# Patient Record
Sex: Male | Born: 1980 | Race: White | Hispanic: No | Marital: Single | Smoking: Current every day smoker
Health system: Southern US, Community
[De-identification: ages and names within clinical notes are randomized; demographics above are authoritative.]

## PROBLEM LIST (undated history)

## (undated) DIAGNOSIS — F1120 Opioid dependence, uncomplicated: Secondary | ICD-10-CM

## (undated) DIAGNOSIS — F329 Major depressive disorder, single episode, unspecified: Secondary | ICD-10-CM

## (undated) DIAGNOSIS — F319 Bipolar disorder, unspecified: Secondary | ICD-10-CM

## (undated) DIAGNOSIS — F32A Depression, unspecified: Secondary | ICD-10-CM

## (undated) DIAGNOSIS — F99 Mental disorder, not otherwise specified: Secondary | ICD-10-CM

## (undated) DIAGNOSIS — B1920 Unspecified viral hepatitis C without hepatic coma: Secondary | ICD-10-CM

---
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
## 2002-08-28 ENCOUNTER — Emergency Department (HOSPITAL_COMMUNITY): Admission: EM | Admit: 2002-08-28 | Discharge: 2002-08-28 | Payer: Self-pay

## 2008-08-10 ENCOUNTER — Emergency Department (HOSPITAL_COMMUNITY): Admission: EM | Admit: 2008-08-10 | Discharge: 2008-08-10 | Payer: Self-pay | Admitting: Emergency Medicine

## 2009-01-19 ENCOUNTER — Emergency Department (HOSPITAL_COMMUNITY): Admission: EM | Admit: 2009-01-19 | Discharge: 2009-01-19 | Payer: Self-pay | Admitting: Emergency Medicine

## 2009-01-28 ENCOUNTER — Emergency Department (HOSPITAL_COMMUNITY): Admission: EM | Admit: 2009-01-28 | Discharge: 2009-01-28 | Payer: Self-pay | Admitting: Emergency Medicine

## 2010-07-28 ENCOUNTER — Emergency Department (HOSPITAL_COMMUNITY): Payer: Self-pay

## 2010-07-28 ENCOUNTER — Emergency Department (HOSPITAL_COMMUNITY)
Admission: EM | Admit: 2010-07-28 | Discharge: 2010-07-28 | Disposition: A | Discharge status: Home / Self Care | Payer: Self-pay | Attending: Emergency Medicine | Admitting: Emergency Medicine

## 2010-07-28 DIAGNOSIS — R296 Repeated falls: Secondary | ICD-10-CM | POA: Insufficient documentation

## 2010-07-28 DIAGNOSIS — M25519 Pain in unspecified shoulder: Secondary | ICD-10-CM | POA: Insufficient documentation

## 2010-07-28 DIAGNOSIS — Y9302 Activity, running: Secondary | ICD-10-CM | POA: Insufficient documentation

## 2010-07-28 DIAGNOSIS — B1920 Unspecified viral hepatitis C without hepatic coma: Secondary | ICD-10-CM | POA: Insufficient documentation

## 2010-08-28 LAB — COMPREHENSIVE METABOLIC PANEL
Albumin: 4.3 g/dL (ref 3.5–5.2)
Alkaline Phosphatase: 82 U/L (ref 39–117)
BUN: 10 mg/dL (ref 6–23)
CO2: 27 mEq/L (ref 19–32)
Chloride: 103 mEq/L (ref 96–112)
Glucose, Bld: 78 mg/dL (ref 70–99)
Potassium: 3.7 mEq/L (ref 3.5–5.1)
Total Bilirubin: 0.6 mg/dL (ref 0.3–1.2)

## 2010-08-28 LAB — CBC
HCT: 45.6 % (ref 39.0–52.0)
Hemoglobin: 15.8 g/dL (ref 13.0–17.0)
WBC: 10.1 10*3/uL (ref 4.0–10.5)

## 2010-08-28 LAB — DIFFERENTIAL
Basophils Absolute: 0.1 10*3/uL (ref 0.0–0.1)
Basophils Relative: 1 % (ref 0–1)
Monocytes Absolute: 1 10*3/uL (ref 0.1–1.0)
Neutro Abs: 7 10*3/uL (ref 1.7–7.7)
Neutrophils Relative %: 69 % (ref 43–77)

## 2010-08-28 LAB — URINALYSIS, ROUTINE W REFLEX MICROSCOPIC
Bilirubin Urine: NEGATIVE
Nitrite: NEGATIVE
Specific Gravity, Urine: 1.006 (ref 1.005–1.030)
Urobilinogen, UA: 0.2 mg/dL (ref 0.0–1.0)

## 2010-08-28 LAB — RAPID URINE DRUG SCREEN, HOSP PERFORMED
Opiates: NOT DETECTED
Tetrahydrocannabinol: POSITIVE — AB

## 2010-08-28 LAB — ETHANOL: Alcohol, Ethyl (B): 58 mg/dL — ABNORMAL HIGH (ref 0–10)

## 2010-10-09 NOTE — H&P (Signed)
[facility]  Patient:    Martin Allen, Martin Allen                         MRN: [id number] Adm. Date:  11/08/99 Attending:  Jasmine Pang, M.D. CC:         Martin Allen at the [facility] Martin Allen, M.D.                   Psychiatric Admission Assessment  LOCATION:  Adolescent unit.  PATIENT IDENTIFICATION:  A 30 year old Caucasian male referred on involuntary papers.  HISTORY OF PRESENT ILLNESS:  The patient has a history of substance abuse (though two drug screens done immediately prior to admission were negative). He has been clean for a period of time, but states he relapsed 13 days ago. On the day of admission, he had become agitated and was acting in a bizarre and threatening manner.  He has also been feeling anxious and states he feels "I am jumping out of my skin."  He does not appear to be withdrawing from particular drugs since his two drug screens (one in the ER and one done in our unit were negative).  PAST PSYCHIATRIC HISTORY:  The patient sees Dr. Fredna Allen. Allen.  He also sees Martin Allen at [facility].  CURRENT MEDICATIONS:  Neurontin 600 mg t.i.d., Wellbutrin 200 mg p.o. b.i.d., Seroquel 300 mg p.o. q.h.s., and Depakote 500 mg p.o. b.i.d.  He states he has not been compliant with these medications recently.  SUBSTANCE ABUSE HISTORY:  The patient abuses Klonopin, cocaine, LSD, ecstasy, and alcohol.  He, as indicated as above, has been clean for a period of time until relapsing 13 days ago.  PAST MEDICAL HISTORY:  The patient has a history of sinus infection, treated with amoxicillin recently.  He also has had an abdominal thyroid profile; this is currently being worked up by his pediatrician.  By his family practice physician, it is thought he may be hyperthyroid.  He has had recent heart palpitations.  ALLERGIES:  CECLOR and PENICILLIN.  CURRENT  MEDICATIONS:  As per past psychiatric history.  SOCIAL HISTORY:  The patient lives with his grandfather.  He had been living with his father and fathers girlfriend, but had to leave their home after some conflicts.  They reportedly have a warrant out for breaking and entering of his fathers girlfriends house.  The patient has been out of school for the past two years.  FAMILY PSYCHIATRIC HISTORY:  There is a possible substance abuse problem on the paternal side.  This will be further investigated.  ABUSE HISTORY:  There has been no sexual or physical abuse.  LEGAL HISTORY:  Positive for the B&E charges.  ADMISSION MENTAL STATUS EXAMINATION:  The patient was a sullen, reserved, Caucasian male with poor eye contact.  Speech slurred (secondary to restarting his medications after having been off of them).  There were psychomotor agitation.  Mood was anxious.  Affect:  Constricted.  There were no suicidal or homicidal ideation.  There was no psychosis or perceptual disturbance. Thought processes were logical and goal-directed.  Thought content revealed no predominant theme.  The patient was alert and oriented x 4.  Short-term and long-term memory were unable to be tested at this time due to his level of distress and agitation.  General fund of knowledge  was age and education level appropriate.  Attention and concentration were poor.  Insight:  Minimal. Judgment:  Poor.  ADMISSION DIAGNOSES: Axis I:    1. Mood disorder, not otherwise specified            2. Polysubstance dependence.            3. Conduct disorder, adolescent onset. Axis II:   Deferred. Axis III:  Rule out hyperthyroidism. Axis IV:   Severe. Axis V:    GAF is 30.  ASSETS AND STRENGTHS:  The patient is healthy and able to be physically active.  The patient has a supportive grandparent.  PROBLEMS:  Mood instability with bizarre threatening behavior.  SHORT-TERM TREATMENT GOAL:  Resolution of threatening  behavior.  LONG-TERM TREATMENT GOAL:  Resolution of mood instability.  INITIAL PLAN OF CARE:  Discontinue Seroquel.  Begin Zyprexa 5 mg p.o. t.i.d. Will also begin Ativan 1 mg p.o. t.i.d.  The patient will be involved in unit therapeutic groups and activities.  The patient will be involved in family Allen.  ESTIMATED LENGTH OF INPATIENT TREATMENT:  FIve to seven days.  CONDITIONS NECESSARY FOR DISCHARGE:  No threatening others, bizarre behavior resolved.  INITIAL DISCHARGE PLAN:  The patient will return home to live with his g grandfather.  FOLLOW-UP Allen:  Will be with Martin Allen at the [facility].  FOLLOW-UP MEDICATION MANAGEMENT:  Martin Allen, M.D. DD:  11/08/99 TD:  11/11/99 Job: [id number]

## 2010-10-09 NOTE — Discharge Summary (Signed)
[facility]  Patient:    Martin Allen, Martin Allen                       MRN: [id number] Adm. Date:  [id number] Disc. Date: [id number] Attending:  Jasmine Pang                           Discharge Summary  DATE OF BIRTH:  [birth year]  REASON FOR ADMISSION:  This 30 year old white male was admitted for inpatient psychiatric stabilization because of the history of increasing symptoms of psychosis, bizarre and threatening behavior, and homicidal ideation.  HISTORY OF PRESENT ILLNESS:  This 30 year old Caucasian male was admitted on involuntary papers.  He has a long history of substance abuse and had apparently been receiving multiple urine drug screens and has had two recent drug screens positive for cannabis.  The patient also had been clean for a period of several months, but had allegedly relapsed 2-3 weeks ago.  On the day of admission he had become increasingly agitated and was acting bizarrely and threatening to kill anyone who got near him.  He also was admitting to feeling that people were out to get him and felt profoundly psychomotor agitated and felt like he wanted to "jump out of my skin."  He admitted to feeling anxious and frightened in that he needed to keep people at a distance because he was afraid they might hurt him and that he would have to harm someone to protect himself if he was threatened.  He reported feeling increasingly threatened to a point where he was felt to be a danger to others and involuntarily admitted.  PHYSICAL EXAMINATION:  Physical examination at the time of admission was remarkable only for a history of having facial plastic surgery following a dog bite as a child.  He otherwise had an unremarkable history and physical examination.  LABORATORY:  The patient underwent a laboratory workup to rule out any medical problems contributing to his symptomatology.  A urine drug screen and repeat were entirely negative.  CBC,  liver function tests, a basic metabolic panel, thyroid function tests, GGT, all were within normal limits.  HOSPITAL COURSE:  The patient on admission was profoundly paranoid, agitated, appeared to have disorganized thoughts.  He felt that one of his peers on the unit was out to harm him and he became increasingly threatening toward that individual, telling him that he would kill him.  He was separated from that individual, but became profoundly agitated and combative, requiring seclusion and restraint to prevent the patient from endangering himself or others.  He was initially started back on his outpatient medication which included Neurontin 600 mg p.o. t.i.d., Wellbutrin 200 mg p.o. b.i.d., Seroquel 300 mg p.o. q.h.s., and Depakote 500 mg p.o. b.i.d.; however, within a period of several hours the patient noted that he had not been taking these medications and had been noncompliant with treatment.  He also admitted to a long history of being noncompliant with taking any medications.  He was placed on a trial of Ativan in combination with Zyprexa which was of no benefit.  He was given an injection of 100 mg of Thorazine to attempt to decrease his agitation and was unresponsive to this.  He was then begun on a trial of haloperidol.  He received a dose of 5 mg p.o. and responded extremely well to this medicine with gradual clearing of psychosis over the  next several days.  The patient has tolerated this medication well with no side effects.  He was also begun on Cogentin at 1 mg p.o. b.i.d. to prevent any extrapyramidal symptoms.  He has received no special procedures or treatments over the course of this inpatient hospitalization.  He has had no complications to the course of his treatment at the present time.  His psychosis has cleared and his mood has stabilized on a combination of Depakote 500 mg p.o. b.i.d. and haloperidol decanoate.  He received an injection at 50 mg IM on November 10, 1999.  He is continuing to take Cogentin at 1 mg p.o. b.i.d. with no symptoms or side effects, and in particular, the patient is displaying no extrapyramidal symptoms.  No consultations were obtained during the course of this hospitalization.  CONDITION ON DISCHARGE:  Improved.  The patient at the present time is showing a euthymic mood.  His thought processes are clearing.  He is able to perform his activities of daily living.  He denies any homicidal or suicidal ideation and has displayed no self injurious behavior and denies any desire to perform any self injurious behavior.  It is felt at this time the patient is now no longer in need of such a restrictive acute treatment and will be ready for transition to a long term residential drug treatment program.  We are informed that this will not be available until at least the beginning of July and until that time, the patient will be transitioned to intensive outpatient therapy to address his mood disorder and chemical dependency problems.  The patients behavior at the time of discharge is that he continues to occasionally display oppositional defiant behavior, but is easily redirected by staff.  He has required no seclusion restraints or otherwise redirection of his behavior over the past 48-72 hours.  POST HOSPITAL CARE PLAN:  The patient is discharged on Haldol decanoate 50 mg IM q.30 days.  His next injection should be on December 10, 1999.  He is also discharged on Depakote 500 mg p.o. b.i.d. and Cogentin 1 mg p.o. b.i.d.  He has good knowledge of his medication and we have discussed the risks, benefits, side effects, and alternatives with both the patient and his grandparents and his father.  He has agreed to take the medication.  We have discussed the multiple alternatives including alternative of using no medication.  They are all in agreement with the current treatment.  After care plan is to go ahead and discharge the patient to the  intensive outpatient program of the [facility] until a long term residential drug treatment program becomes available.  The patient is scheduled for a slot in the beginning to middle of July.  Once a bed opens up  in that program he will be transitioned into that program.  The patient will also require follow-up with his outpatient psychiatrist through the intensive outpatient program and will require monitoring of his Depakote which included CBC and liver function tests, as well as valproic acid level in 1-2 weeks. The patient will also require at least weekly urine drug screens while in that program.  DIET:  His diet is unrestricted.  ACTIVITY:  His levels of activity are unrestricted.  FINAL DIAGNOSES: Axis I:    1. Schizoaffective disorder.            2. Rule out polysubstance induced psychosis (probable).            3.  Rule out bipolar disorder, mixed type, severe, with mood               congruent psychosis (possible).            4. Polysubstance dependence.            5. Conduct disorder. Axis II:   Rule out personality disorder, not otherwise specified. Axis III:  None. Axis IV:   Severe. Axis V:    On admission code 25, on discharge code 50-60. DD:  11/11/99 TD:  11/12/99 Job: [id number]

## 2011-01-16 ENCOUNTER — Emergency Department (HOSPITAL_COMMUNITY): Payer: Self-pay

## 2011-01-16 ENCOUNTER — Emergency Department (HOSPITAL_COMMUNITY)
Admission: EM | Admit: 2011-01-16 | Discharge: 2011-01-16 | Disposition: A | Discharge status: Home / Self Care | Payer: Self-pay | Attending: Emergency Medicine | Admitting: Emergency Medicine

## 2011-01-16 DIAGNOSIS — Z8619 Personal history of other infectious and parasitic diseases: Secondary | ICD-10-CM | POA: Insufficient documentation

## 2011-01-16 DIAGNOSIS — M25519 Pain in unspecified shoulder: Secondary | ICD-10-CM | POA: Insufficient documentation

## 2011-02-11 ENCOUNTER — Emergency Department (HOSPITAL_COMMUNITY)
Admission: EM | Admit: 2011-02-11 | Discharge: 2011-02-12 | Disposition: A | Discharge status: Home / Self Care | Payer: Self-pay | Attending: Emergency Medicine | Admitting: Emergency Medicine

## 2011-02-11 DIAGNOSIS — IMO0002 Reserved for concepts with insufficient information to code with codable children: Secondary | ICD-10-CM | POA: Insufficient documentation

## 2011-02-11 DIAGNOSIS — Z8619 Personal history of other infectious and parasitic diseases: Secondary | ICD-10-CM | POA: Insufficient documentation

## 2011-02-11 DIAGNOSIS — R51 Headache: Secondary | ICD-10-CM | POA: Insufficient documentation

## 2011-02-11 DIAGNOSIS — F1110 Opioid abuse, uncomplicated: Secondary | ICD-10-CM | POA: Insufficient documentation

## 2011-02-11 LAB — COMPREHENSIVE METABOLIC PANEL WITH GFR
ALT: 90 U/L — ABNORMAL HIGH (ref 0–53)
AST: 38 U/L — ABNORMAL HIGH (ref 0–37)
Albumin: 4.1 g/dL (ref 3.5–5.2)
Alkaline Phosphatase: 89 U/L (ref 39–117)
BUN: 18 mg/dL (ref 6–23)
CO2: 28 meq/L (ref 19–32)
Calcium: 9.6 mg/dL (ref 8.4–10.5)
Chloride: 99 meq/L (ref 96–112)
Creatinine, Ser: 0.81 mg/dL (ref 0.50–1.35)
GFR calc Af Amer: >60 mL/min
GFR calc non Af Amer: >60 mL/min
Glucose, Bld: 118 mg/dL — ABNORMAL HIGH (ref 70–99)
Potassium: 3.7 meq/L (ref 3.5–5.1)
Sodium: 137 meq/L (ref 135–145)
Total Bilirubin: 0.6 mg/dL (ref 0.3–1.2)
Total Protein: 7.6 g/dL (ref 6.0–8.3)

## 2011-02-11 LAB — CBC
HCT: 46 % (ref 39.0–52.0)
Hemoglobin: 16.7 g/dL (ref 13.0–17.0)
MCV: 89.8 fL (ref 78.0–100.0)
RBC: 5.12 MIL/uL (ref 4.22–5.81)
WBC: 13.1 10*3/uL — ABNORMAL HIGH (ref 4.0–10.5)

## 2011-02-11 LAB — DIFFERENTIAL
Basophils Absolute: 0 10*3/uL (ref 0.0–0.1)
Basophils Relative: 0 % (ref 0–1)
Eosinophils Absolute: 0.3 10*3/uL (ref 0.0–0.7)
Eosinophils Relative: 3 % (ref 0–5)
Lymphocytes Relative: 21 % (ref 12–46)
Lymphs Abs: 2.7 10*3/uL (ref 0.7–4.0)
Monocytes Absolute: 1.8 10*3/uL — ABNORMAL HIGH (ref 0.1–1.0)
Monocytes Relative: 14 % — ABNORMAL HIGH (ref 3–12)
Neutro Abs: 8.2 10*3/uL — ABNORMAL HIGH (ref 1.7–7.7)
Neutrophils Relative %: 63 % (ref 43–77)

## 2011-02-12 LAB — RAPID URINE DRUG SCREEN, HOSP PERFORMED
Barbiturates: NOT DETECTED
Cocaine: NOT DETECTED
Opiates: POSITIVE — AB

## 2011-11-09 ENCOUNTER — Emergency Department (HOSPITAL_COMMUNITY)
Admission: EM | Admit: 2011-11-09 | Discharge: 2011-11-09 | Disposition: A | Discharge status: Home / Self Care | Payer: Self-pay | Attending: Emergency Medicine | Admitting: Emergency Medicine

## 2011-11-09 ENCOUNTER — Encounter (HOSPITAL_COMMUNITY): Payer: Self-pay

## 2011-11-09 DIAGNOSIS — F1910 Other psychoactive substance abuse, uncomplicated: Secondary | ICD-10-CM

## 2011-11-09 DIAGNOSIS — F1520 Other stimulant dependence, uncomplicated: Secondary | ICD-10-CM | POA: Insufficient documentation

## 2011-11-09 DIAGNOSIS — F1120 Opioid dependence, uncomplicated: Secondary | ICD-10-CM | POA: Insufficient documentation

## 2011-11-09 DIAGNOSIS — G4700 Insomnia, unspecified: Secondary | ICD-10-CM | POA: Insufficient documentation

## 2011-11-09 DIAGNOSIS — F17200 Nicotine dependence, unspecified, uncomplicated: Secondary | ICD-10-CM | POA: Insufficient documentation

## 2011-11-09 DIAGNOSIS — F411 Generalized anxiety disorder: Secondary | ICD-10-CM | POA: Insufficient documentation

## 2011-11-09 DIAGNOSIS — F419 Anxiety disorder, unspecified: Secondary | ICD-10-CM

## 2011-11-09 DIAGNOSIS — F19939 Other psychoactive substance use, unspecified with withdrawal, unspecified: Secondary | ICD-10-CM | POA: Insufficient documentation

## 2011-11-09 HISTORY — DX: Unspecified viral hepatitis C without hepatic coma: B19.20

## 2011-11-09 LAB — COMPREHENSIVE METABOLIC PANEL
Alkaline Phosphatase: 92 U/L (ref 39–117)
BUN: 15 mg/dL (ref 6–23)
CO2: 30 mEq/L (ref 19–32)
GFR calc Af Amer: >90 mL/min (ref >90)
GFR calc non Af Amer: >90 mL/min (ref >90)
Glucose, Bld: 106 mg/dL — ABNORMAL HIGH (ref 70–99)
Potassium: 4.3 mEq/L (ref 3.5–5.1)
Total Protein: 7.9 g/dL (ref 6.0–8.3)

## 2011-11-09 LAB — CBC
HCT: 46.9 % (ref 39.0–52.0)
Hemoglobin: 16 g/dL (ref 13.0–17.0)
MCH: 30.9 pg (ref 26.0–34.0)
MCHC: 34.1 g/dL (ref 30.0–36.0)
RBC: 5.17 MIL/uL (ref 4.22–5.81)

## 2011-11-09 LAB — RAPID URINE DRUG SCREEN, HOSP PERFORMED
Barbiturates: NOT DETECTED
Cocaine: NOT DETECTED
Tetrahydrocannabinol: POSITIVE — AB

## 2011-11-09 LAB — ETHANOL: Alcohol, Ethyl (B): <11 mg/dL (ref 0–11)

## 2011-11-09 MED ORDER — ALPRAZOLAM 1 MG PO TABS: 1.0000 mg | ORAL_TABLET | Freq: Every evening | ORAL | Status: AC | PRN

## 2011-11-09 NOTE — ED Provider Notes (Signed)
History     CSN: [id number]  Arrival date & time 11/09/11  1743   First MD Initiated Contact with Patient 11/09/11 1952      Chief Complaint  Patient presents with  . Insomnia  . Anxiety    (Consider location/radiation/quality/duration/timing/severity/associated sxs/prior treatment) Patient is a 31 y.o. male presenting with anxiety. The history is provided by the patient.  Anxiety This is a new problem. Pertinent negatives include no chest pain, no abdominal pain, no headaches and no shortness of breath.   patient is a polysubstance abuser. He is a heavy heroin user intravenously. He recently detoxed himself using Xanax. He states he used about a quarter gram a day of heroin. He states his 5-6 mg of Xanax a day for 5 days. Then 3 days ago he came back to town and used some over-the-counter amphetamines. He states he has not slept since he used them. He states he feels very anxious. He does not have much of a history of anxiety. He states he will get occasional waves of nausea but feels it is mostly detox off of heroin. He's been using melatonin but states he has not slept in 3 days. He is hesitant to use other medications. His sobriety plan includes going to NA meetings. He was recently living in the back of his girlfriend's car, but states that she is now in treatment and he is living with his grandparents. He asked to have his liver enzymes checked.  Past Medical History  Diagnosis Date  . Hepatitis C virus     History reviewed. No pertinent past surgical history.  History reviewed. No pertinent family history.  History  Substance Use Topics  . Smoking status: Current Everyday Smoker -- 1.0 packs/day  . Smokeless tobacco: Not on file  . Alcohol Use: No      Review of Systems  Constitutional: Negative for activity change and appetite change.  HENT: Negative for neck stiffness.   Eyes: Negative for pain.  Respiratory: Negative for chest tightness and shortness of breath.     Cardiovascular: Negative for chest pain and leg swelling.  Gastrointestinal: Positive for nausea. Negative for vomiting, abdominal pain and diarrhea.  Genitourinary: Negative for flank pain.  Musculoskeletal: Negative for back pain.  Skin: Negative for rash.  Neurological: Negative for weakness, numbness and headaches.  Psychiatric/Behavioral: Negative for behavioral problems.       Anxiety, insomnia. No suicidal or homicidal thoughts.    Allergies  Ceclor and Penicillins  Home Medications   Current Outpatient Rx  Name Route Sig Dispense Refill  . B COMPLEX PO TABS Oral Take 1 tablet by mouth daily.    . OMEGA-3 FATTY ACIDS 1000 MG PO CAPS Oral Take 1 g by mouth daily.    Marland Kitchen MELATONIN 3 MG PO CAPS Oral Take 2 capsules by mouth once.    Marland Kitchen MILK THISTLE 175 MG PO TABS Oral Take 175 mg by mouth daily.    Marland Kitchen ALPRAZOLAM 1 MG PO TABS Oral Take 1 tablet (1 mg total) by mouth at bedtime as needed for sleep. 6 tablet 0    BP 118/79  Pulse 109  Temp 98.2 F (36.8 C) (Oral)  Resp 22  SpO2 100%  Physical Exam  Nursing note and vitals reviewed. Constitutional: He is oriented to person, place, and time. He appears well-developed and well-nourished.  HENT:  Head: Normocephalic and atraumatic.  Eyes: EOM are normal. Pupils are equal, round, and reactive to light.  Pupils are dilated but reactive.  Neck: Normal range of motion. Neck supple.  Cardiovascular: Normal rate, regular rhythm and normal heart sounds.   No murmur heard. Pulmonary/Chest: Effort normal and breath sounds normal.  Abdominal: Soft. Bowel sounds are normal. He exhibits no distension and no mass. There is no tenderness. There is no rebound and no guarding.  Musculoskeletal: Normal range of motion. He exhibits no edema.  Neurological: He is alert and oriented to person, place, and time. No cranial nerve deficit.  Skin: Skin is warm and dry.  Psychiatric: He has a normal mood and affect.    ED Course  Procedures  (including critical care time)  Labs Reviewed  CBC - Abnormal; Notable for the following:    WBC 11.3 (*)     All other components within normal limits  COMPREHENSIVE METABOLIC PANEL - Abnormal; Notable for the following:    Glucose, Bld 106 (*)     AST 181 (*)     ALT 317 (*)     All other components within normal limits  URINE RAPID DRUG SCREEN (HOSP PERFORMED) - Abnormal; Notable for the following:    Opiates POSITIVE (*)     Benzodiazepines POSITIVE (*)     Amphetamines POSITIVE (*)     Tetrahydrocannabinol POSITIVE (*)     All other components within normal limits  ETHANOL  ACETAMINOPHEN LEVEL   No results found.   1. Anxiety   2. Substance abuse       MDM  Patient feels anxious and has not been sleeping after substance abuse and withdrawal. He last used some stimulants 3 days ago. He states he has not slept since. His LFTs are elevated, but he has hepatitis C. Patient was seen by Child psychotherapist and they decided that outpatient treatment may be the best for him. She'll be discharged home with a few benzodiazepines.        Juliet Rude. Rubin Payor, MD 11/09/11 2208

## 2011-11-09 NOTE — Discharge Instructions (Signed)
Chemical Dependency Chemical dependency is an addiction to drugs or alcohol. It is characterized by the repeated behavior of seeking out and using drugs and alcohol despite harmful consequences to the health and safety of ones self and others.  RISK FACTORS There are certain situations or behaviors that increase a person's risk for chemical dependency. These include:  A family history of chemical dependency.   A history of mental health issues, including depression and anxiety.   A home environment where drugs and alcohol are easily available to you.   Drug or alcohol use at a young age.  SYMPTOMS  The following symptoms can indicate chemical dependency:  Inability to limit the use of drugs or alcohol.   Nausea, sweating, shakiness, and anxiety that occurs when alcohol or drugs are not being used.   An increase in amount of drugs or alcohol that is necessary to get drunk or high.  People who experience these symptoms can assess their use of drugs and alcohol by asking themselves the following questions:  Have you been told by friends or family that they are worried about your use of alcohol or drugs?   Do friends and family ever tell you about things you did while drinking alcohol or using drugs that you do not remember?   Do you lie about using alcohol or drugs or about the amounts you use?   Do you have difficulty completing daily tasks unless you use alcohol or drugs?   Is the level of your work or school performance lower because of your drug or alcohol use?   Do you get sick from using drugs or alcohol but keep using anyway?   Do you feel uncomfortable in social situations unless you use alcohol or drugs?   Do you use drugs or alcohol to help forget problems?  An answer of yes to any of these questions may indicate chemical dependency. Professional evaluation is suggested. Document Released: 05/04/2001 Document Revised: 04/29/2011 Document Reviewed: 07/16/2010 [facility]  Patient Information 2012 [place], [place].Drug Abuse and Addiction in Sports There are many types of drugs that one may become addicted to including illegal drugs (marijuana, cocaine, amphetamines, hallucinogens, and narcotics), prescription drugs (hydrocodone, codeine, and alprazolam), and other chemicals such as alcohol or nicotine. Two types of addiction exist: physical and emotional. Physical addiction usually occurs after prolonged use of a drug. However, some drugs may only take a couple uses before addiction can occur. Physical addiction is marked by withdrawal symptoms, in which the person experiences negative symptoms such as sweat, anxiety, tremors, hallucinations, or cravings in the absence of using the drug. Emotional dependence is the psychological desire for the "high" that the drugs produce when taken. SYMPTOMS   Inattentiveness.   Negligence.   Forgetfulness.   Insomnia.   Mood swings.  RISK INCREASES WITH:   Family history of addiction.   Personal history of addictive personality.  Studies have shown that risktakers, which many athletes are, have a higher risk of addiction. PREVENTION The only adequate prevention of drug abuse is abstinence from drugs. TREATMENT  The first step in quitting substance abuse is recognizing the problem and realizing that one has the power to change. Quitting requires a plan and support from others. It is often necessary to seek medical assistance. Caregivers are available to offer counseling, and for certain cases, medicine to diminish the physical symptoms of withdrawal. Many organizations exist such as Alcoholics Anonymous, Narcotics Anonymous, or the [employer] on Alcoholism that offer support for individuals who  have chosen to quit their habits. Document Released: 05/10/2005 Document Revised: 04/29/2011 Document Reviewed: 08/22/2008 [facility] Patient Information 2012 [place], [place].

## 2011-11-09 NOTE — BH Assessment (Signed)
Assessment Note   Martin Allen is an 31 y.o. male. Pt reported to the [facility] with a chief complaint of anxiety and insomnia. Pt states that he had been using heroin since February though stopped using last week. Pt states that he began taking Xanax at 6mg  to go through detox at home. Pt states that he used herion one last time and over the counter amphetamines 2 days ago. Pt states that he has had increased anxiety and difficulty sleeping x3 days. Pt is not requesting detox at this time, adding that he does not want to be inpatient because he is working at [employer] and does not want to lose his job. Pt shared that he is currently living with his grandparents while his girlfriend completes inpatient rehab for heroin use at [facility]. At this time, pt is refusing inpatient treatment, stating that he preferred CDIOP and medication to assist with sleeping.  This Clinical research associate provided the pt with outpatient referral information to the [facility] and [facility]. Pt is currently attending [facility] and has a sponsor through [facility] as well.   EDP notified of pt's decision to stay outpatient and is in agreement with the disposition.   Axis I: Substance Abuse Axis II: Deferred Axis III:  Past Medical History  Diagnosis Date  . Hepatitis C virus    Axis IV: other psychosocial or environmental problems Axis V: 51-60 moderate symptoms  Past Medical History:  Past Medical History  Diagnosis Date  . Hepatitis C virus     History reviewed. No pertinent past surgical history.  Family History: History reviewed. No pertinent family history.  Social History:  reports that he has been smoking.  He does not have any smokeless tobacco history on file. He reports that he uses illicit drugs. He reports that he does not drink alcohol.  Additional Social History:     CIWA: CIWA-Ar BP: 118/79 mmHg Pulse Rate: 109  COWS:    Allergies:  Allergies  Allergen Reactions  . Ceclor (Cefaclor) Other (See Comments)    unknown  .  Penicillins Other (See Comments)    unknown    Home Medications:  (Not in a hospital admission)  OB/GYN Status:  No LMP for male patient.  General Assessment Data Location of Assessment: [facility] Living Arrangements: Other relatives (grandparents) Can pt return to current living arrangement?: Yes Admission Status: Voluntary Is patient capable of signing voluntary admission?: Yes Transfer from: Acute Hospital Referral Source: Self/Family/Friend  Education Status Is patient currently in school?: No  Risk to self Suicidal Ideation: No Suicidal Intent: No Is patient at risk for suicide?: No Suicidal Plan?: No Access to Means: No What has been your use of drugs/alcohol within the last 12 months?: Heroin-last use on 11/07/11; Amphetamines last use 11/07/11 Previous Attempts/Gestures: No How many times?: 0  Other Self Harm Risks: no Triggers for Past Attempts: None known Intentional Self Injurious Behavior: None Family Suicide History: No Recent stressful life event(s):  (pt denies) Persecutory voices/beliefs?: No Depression: Yes (situational ) Depression Symptoms: Insomnia Substance abuse history and/or treatment for substance abuse?: Yes Suicide prevention information given to non-admitted patients: Not applicable  Risk to Others Homicidal Ideation: No Thoughts of Harm to Others: No Current Homicidal Intent: No Current Homicidal Plan: No Access to Homicidal Means: No Identified Victim: none History of harm to others?: No Assessment of Violence: None Noted Violent Behavior Description: pt calm and cooperative Does patient have access to weapons?: No Criminal Charges Pending?: No Does patient have a  court date: No  Psychosis Hallucinations: None noted Delusions: None noted  Mental Status Report Appear/Hygiene: Other (Comment) (appropriate to circumstances) Eye Contact: Good Motor Activity: Hyperactivity Speech: Logical/coherent Level of Consciousness: Alert Mood:   (appropriate to circumstances) Affect: Appropriate to circumstance Anxiety Level: Minimal Thought Processes: Coherent;Relevant Judgement: Unimpaired Orientation: Person;Place;Time;Situation Obsessive Compulsive Thoughts/Behaviors: None  Cognitive Functioning Concentration: Normal Memory: Recent Intact;Remote Intact IQ: Average Insight: Good Impulse Control: Fair Appetite: Fair Weight Loss: 0  Weight Gain: 0  Sleep: Decreased Total Hours of Sleep: 0  (no sleep in 3 days) Vegetative Symptoms: None  ADLScreening [facility] Assessment Services) Patient's cognitive ability adequate to safely complete daily activities?: Yes Patient able to express need for assistance with ADLs?: Yes Independently performs ADLs?: Yes  Abuse/Neglect [facility]) Physical Abuse: Denies Verbal Abuse: Denies Sexual Abuse: Denies  Prior Inpatient Therapy Prior Inpatient Therapy: Yes Prior Therapy Dates: multiple Prior Therapy Facilty/Provider(s): [facility] Reason for Treatment: [facility]  Prior Outpatient Therapy Prior Outpatient Therapy: Yes Prior Therapy Dates: unkn3 Prior Therapy Facilty/Provider(s): [facility] Reason for Treatment: [facility]  ADL Screening (condition at time of admission) Patient's cognitive ability adequate to safely complete daily activities?: Yes Patient able to express need for assistance with ADLs?: Yes Independently performs ADLs?: Yes       Abuse/Neglect Assessment (Assessment to be complete while patient is alone) Physical Abuse: Denies Verbal Abuse: Denies Sexual Abuse: Denies          Additional Information 1:1 In Past 12 Months?: No CIRT Risk: No Elopement Risk: No Does patient have medical clearance?: Yes     Disposition:  Disposition Disposition of Patient: Outpatient treatment;Referred to ([facility] for CDIOP) Type of outpatient treatment: [facility] - Intensive Outpatient Patient referred to: [facility];Other (Comment) ([facility])  On Site Evaluation by:   Reviewed  with Physician:     Nevada Crane F 11/09/2011 9:36 PM

## 2011-11-09 NOTE — ED Notes (Addendum)
Pt had gone through his own detox over a 5 day period, last Tues-Sunday using xanax 4-6mg /day for 1/2gm of heroin use/day.  Had been using heroin since Feb after being "clean for a while."  On Sunday he used heroin and amphetamines once, after going through his home detox.  He is here b/c he is very anxious, feels like his heart is racing and like it's going to pop out of his chest.  States a little bit of chest tightness w/tingling in Lt shoulder, on occasion.  Denies any SI/HI.  He is exhausted but can't sleep.  He has tried to use some melatonin to help him sleep but it hasn't helped.

## 2011-12-24 ENCOUNTER — Encounter (HOSPITAL_COMMUNITY): Payer: Self-pay

## 2011-12-24 ENCOUNTER — Emergency Department (HOSPITAL_COMMUNITY)
Admission: EM | Admit: 2011-12-24 | Discharge: 2011-12-25 | Disposition: A | Discharge status: Home / Self Care | Payer: Self-pay | Attending: Emergency Medicine | Admitting: Emergency Medicine

## 2011-12-24 DIAGNOSIS — Z880 Allergy status to penicillin: Secondary | ICD-10-CM | POA: Insufficient documentation

## 2011-12-24 DIAGNOSIS — Z881 Allergy status to other antibiotic agents status: Secondary | ICD-10-CM | POA: Insufficient documentation

## 2011-12-24 DIAGNOSIS — R21 Rash and other nonspecific skin eruption: Secondary | ICD-10-CM | POA: Insufficient documentation

## 2011-12-24 DIAGNOSIS — F17200 Nicotine dependence, unspecified, uncomplicated: Secondary | ICD-10-CM | POA: Insufficient documentation

## 2011-12-24 DIAGNOSIS — W57XXXA Bitten or stung by nonvenomous insect and other nonvenomous arthropods, initial encounter: Secondary | ICD-10-CM | POA: Insufficient documentation

## 2011-12-24 DIAGNOSIS — B182 Chronic viral hepatitis C: Secondary | ICD-10-CM | POA: Insufficient documentation

## 2011-12-24 DIAGNOSIS — S30860A Insect bite (nonvenomous) of lower back and pelvis, initial encounter: Secondary | ICD-10-CM | POA: Insufficient documentation

## 2011-12-24 MED ORDER — PREDNISONE 10 MG PO TABS: 20.0000 mg | ORAL_TABLET | Freq: Every day | ORAL | Status: DC

## 2011-12-24 NOTE — ED Notes (Signed)
Pt presents with no acuate distress- ? Insect bite on Tuesday c/o of itching- rash to back- no problems with swallowing

## 2011-12-24 NOTE — ED Provider Notes (Signed)
History     CSN: [id number]  Arrival date & time 12/24/11  2130   None     Chief Complaint  Patient presents with  . Insect Bite  . Rash    (Consider location/radiation/quality/duration/timing/severity/associated sxs/prior treatment) Patient is a 31 y.o. male presenting with rash. The history is provided by the patient. No language interpreter was used.  Rash  This is a new problem. The current episode started more than 2 days ago. The problem has been gradually worsening. The problem is associated with a spider bite. There has been no fever. The rash is present on the torso. The pain is at a severity of 5/10. The pain is moderate. The pain has been constant since onset. Associated symptoms include itching and pain. He has tried nothing for the symptoms.   31 yo male reports mosquitoes/spider bite to his left hip 3 days ago. Since then it has been itching and getting bigger with a rash. Patient is taking nothing for the itching and rash. No fever n/v. pmh hep c. Smoker.  Past Medical History  Diagnosis Date  . Hepatitis C virus     History reviewed. No pertinent past surgical history.  No family history on file.  History  Substance Use Topics  . Smoking status: Current Everyday Smoker -- 1.0 packs/day  . Smokeless tobacco: Not on file  . Alcohol Use: No      Review of Systems  Constitutional: Negative.   HENT: Negative.   Eyes: Negative.   Cardiovascular: Negative.   Gastrointestinal: Negative.   Skin: Positive for itching and rash.  Neurological: Negative.   Psychiatric/Behavioral: Negative.   All other systems reviewed and are negative.    Allergies  Ceclor and Penicillins  Home Medications  No current outpatient prescriptions on file.  There were no vitals taken for this visit.  Physical Exam  Nursing note and vitals reviewed. Constitutional: He is oriented to person, place, and time. He appears well-developed and well-nourished.  HENT:  Head:  Normocephalic.  Eyes: Conjunctivae and EOM are normal. Pupils are equal, round, and reactive to light.  Neck: Normal range of motion. Neck supple.  Cardiovascular: Normal rate.   Pulmonary/Chest: Effort normal.  Abdominal: Soft.  Musculoskeletal: Normal range of motion.  Neurological: He is alert and oriented to person, place, and time.  Skin: Skin is warm and dry. Rash noted.       Rash to r flank ? Spider bite hot to touch.  Doubt cellulitis.   Psychiatric: He has a normal mood and affect.    ED Course  Procedures (including critical care time)  Labs Reviewed - No data to display No results found.   No diagnosis found.    MDM  Itching rash to flank x 3 days.  No fever, nausea vomiting, h/a or abdominal pain.  Benardryl, calamine lotion, ice and prednisone.  Tetanus up to date.  Non toxic appearance.  Follow up with pcp of choice or return if worse.         Remi Haggard, NP 12/25/11 1423

## 2011-12-25 NOTE — ED Provider Notes (Signed)
Medical screening examination/treatment/procedure(s) were performed by non-physician practitioner and as supervising physician I was immediately available for consultation/collaboration.   Alynna Hargrove, MD 12/25/11 2251 

## 2012-03-06 ENCOUNTER — Emergency Department (HOSPITAL_COMMUNITY)
Admission: EM | Admit: 2012-03-06 | Discharge: 2012-03-07 | Disposition: A | Discharge status: Home / Self Care | Payer: Self-pay | Attending: Emergency Medicine | Admitting: Emergency Medicine

## 2012-03-06 ENCOUNTER — Encounter (HOSPITAL_COMMUNITY): Payer: Self-pay | Admitting: Emergency Medicine

## 2012-03-06 DIAGNOSIS — F1910 Other psychoactive substance abuse, uncomplicated: Secondary | ICD-10-CM | POA: Insufficient documentation

## 2012-03-06 LAB — RAPID URINE DRUG SCREEN, HOSP PERFORMED
Opiates: POSITIVE — AB
Tetrahydrocannabinol: POSITIVE — AB

## 2012-03-06 NOTE — ED Notes (Signed)
Pt states he is here for inpt treatment  Pt states he has been using heroine and other things  Pt states he uses alcohol as well  Last used today and last drank yesterday

## 2012-03-07 LAB — CBC
HCT: 42.5 % (ref 39.0–52.0)
Hemoglobin: 14.8 g/dL (ref 13.0–17.0)
MCHC: 34.8 g/dL (ref 30.0–36.0)
RBC: 4.75 MIL/uL (ref 4.22–5.81)
WBC: 9.1 10*3/uL (ref 4.0–10.5)

## 2012-03-07 LAB — COMPREHENSIVE METABOLIC PANEL
ALT: 294 U/L — ABNORMAL HIGH (ref 0–53)
CO2: 27 mEq/L (ref 19–32)
Calcium: 9.6 mg/dL (ref 8.4–10.5)
Creatinine, Ser: 0.84 mg/dL (ref 0.50–1.35)
GFR calc Af Amer: >90 mL/min (ref >90)
GFR calc non Af Amer: >90 mL/min (ref >90)
Glucose, Bld: 117 mg/dL — ABNORMAL HIGH (ref 70–99)
Sodium: 135 mEq/L (ref 135–145)

## 2012-03-07 LAB — ACETAMINOPHEN LEVEL: Acetaminophen (Tylenol), Serum: <15 ug/mL (ref 10–30)

## 2012-03-07 MED ORDER — NICOTINE 21 MG/24HR TD PT24: 21.0000 mg | MEDICATED_PATCH | Freq: Once | TRANSDERMAL | Status: DC

## 2012-03-07 NOTE — ED Provider Notes (Signed)
History     CSN: [id number]  Arrival date & time 03/06/12  2203   First MD Initiated Contact with Patient 03/06/12 2344      Chief Complaint  Patient presents with  . Medical Clearance    (Consider location/radiation/quality/duration/timing/severity/associated sxs/prior treatment) HPI 31 year old male presents to emergency room requesting detox. Patient has been using heroin, marijuana, cocaine, and alcohol. He reports he does not drink daily, does not use heroin daily. Last time he drank was yesterday. He last used heroin earlier today. He does not get withdrawal symptoms when he goes a day or 2 without using heroin. He is hoping to get into a residential facility in order to get clean. He has done this for the past. He feels that he will require a detox inpatient program prior to going to a residential rehabilitation facility. He denies any other medical problems other than hepatitis C for which he is not receiving treatment.  Past Medical History  Diagnosis Date  . Hepatitis C virus     History reviewed. No pertinent past surgical history.  Family History  Problem Relation Age of Onset  . Hypertension Other     History  Substance Use Topics  . Smoking status: Current Every Day Smoker -- 1.0 packs/day    Types: Cigarettes  . Smokeless tobacco: Not on file  . Alcohol Use: Yes     occ      Review of Systems  All other systems reviewed and are negative.    Allergies  Ceclor and Penicillins  Home Medications   Current Outpatient Rx  Name Route Sig Dispense Refill  . OMEGA-3-ACID ETHYL ESTERS 1 G PO CAPS Oral Take 2 g by mouth daily.    Marland Kitchen VITAMIN B-12 100 MCG PO TABS Oral Take 50 mcg by mouth daily.      BP 118/75  Pulse 87  Temp 98.4 F (36.9 C) (Oral)  Resp 18  SpO2 97%  Physical Exam  Nursing note and vitals reviewed. Constitutional: He is oriented to person, place, and time. He appears well-developed and well-nourished.  HENT:  Head: Normocephalic  and atraumatic.  Nose: Nose normal.  Mouth/Throat: Oropharynx is clear and moist.  Eyes: Conjunctivae normal and EOM are normal. Pupils are equal, round, and reactive to light.  Neck: Normal range of motion. Neck supple. No JVD present. No tracheal deviation present. No thyromegaly present.  Cardiovascular: Normal rate, regular rhythm, normal heart sounds and intact distal pulses.  Exam reveals no gallop and no friction rub.   No murmur heard. Pulmonary/Chest: Effort normal and breath sounds normal. No stridor. No respiratory distress. He has no wheezes. He has no rales. He exhibits no tenderness.  Abdominal: Soft. Bowel sounds are normal. He exhibits no distension and no mass. There is no tenderness. There is no rebound and no guarding.  Musculoskeletal: Normal range of motion. He exhibits no edema and no tenderness.  Lymphadenopathy:    He has no cervical adenopathy.  Neurological: He is alert and oriented to person, place, and time. He exhibits normal muscle tone. Coordination normal.  Skin: Skin is warm and dry. No rash noted. No erythema. No pallor.  Psychiatric: He has a normal mood and affect. His behavior is normal. Judgment and thought content normal.    ED Course  Procedures (including critical care time)  Labs Reviewed  COMPREHENSIVE METABOLIC PANEL - Abnormal; Notable for the following:    Potassium 3.2 (*)     Glucose, Bld 117 (*)  AST 136 (*)     ALT 294 (*)     All other components within normal limits  SALICYLATE LEVEL - Abnormal; Notable for the following:    Salicylate Lvl <2.0 (*)     All other components within normal limits  URINE RAPID DRUG SCREEN (HOSP PERFORMED) - Abnormal; Notable for the following:    Opiates POSITIVE (*)     Cocaine POSITIVE (*)     Tetrahydrocannabinol POSITIVE (*)     All other components within normal limits  CBC  ETHANOL  ACETAMINOPHEN LEVEL   No results found.   1. Polysubstance abuse       MDM  31 year old male with  polysubstance abuse. No signs of withdrawal currently, do not feel patient requires inpatient detox. We'll offer him outpatient resources for rehabilitation services.        Olivia Mackie, MD 03/07/12 [phone]

## 2012-04-01 ENCOUNTER — Encounter (HOSPITAL_COMMUNITY): Payer: Self-pay

## 2012-04-01 ENCOUNTER — Emergency Department (HOSPITAL_COMMUNITY)
Admission: EM | Admit: 2012-04-01 | Discharge: 2012-04-03 | Disposition: A | Discharge status: Home / Self Care | Payer: Self-pay | Attending: Emergency Medicine | Admitting: Emergency Medicine

## 2012-04-01 DIAGNOSIS — F17200 Nicotine dependence, unspecified, uncomplicated: Secondary | ICD-10-CM | POA: Insufficient documentation

## 2012-04-01 DIAGNOSIS — Z8619 Personal history of other infectious and parasitic diseases: Secondary | ICD-10-CM | POA: Insufficient documentation

## 2012-04-01 DIAGNOSIS — F1120 Opioid dependence, uncomplicated: Secondary | ICD-10-CM | POA: Insufficient documentation

## 2012-04-01 LAB — COMPREHENSIVE METABOLIC PANEL
ALT: 227 U/L — ABNORMAL HIGH (ref 0–53)
AST: 122 U/L — ABNORMAL HIGH (ref 0–37)
Alkaline Phosphatase: 90 U/L (ref 39–117)
CO2: 31 mEq/L (ref 19–32)
Chloride: 99 mEq/L (ref 96–112)
Creatinine, Ser: 0.86 mg/dL (ref 0.50–1.35)
GFR calc non Af Amer: >90 mL/min (ref >90)
Potassium: 4 mEq/L (ref 3.5–5.1)
Sodium: 137 mEq/L (ref 135–145)
Total Bilirubin: 0.4 mg/dL (ref 0.3–1.2)

## 2012-04-01 LAB — CBC
MCV: 89.2 fL (ref 78.0–100.0)
Platelets: 246 10*3/uL (ref 150–400)
RBC: 4.93 MIL/uL (ref 4.22–5.81)
WBC: 8.8 10*3/uL (ref 4.0–10.5)

## 2012-04-01 LAB — RAPID URINE DRUG SCREEN, HOSP PERFORMED
Amphetamines: NOT DETECTED
Barbiturates: NOT DETECTED
Tetrahydrocannabinol: POSITIVE — AB

## 2012-04-01 MED ORDER — ONDANSETRON HCL 4 MG PO TABS: 4.0000 mg | ORAL_TABLET | Freq: Three times a day (TID) | ORAL | Status: DC | PRN

## 2012-04-01 MED ORDER — IBUPROFEN 600 MG PO TABS
600.0000 mg | ORAL_TABLET | Freq: Three times a day (TID) | ORAL | Status: DC | PRN
  Filled 2012-04-01: qty 1

## 2012-04-01 MED ORDER — LORAZEPAM 1 MG PO TABS
1.0000 mg | ORAL_TABLET | Freq: Three times a day (TID) | ORAL | Status: DC | PRN
  Administered 2012-04-01 – 2012-04-03 (×4): 1 mg via ORAL
  Filled 2012-04-01 (×4): qty 1

## 2012-04-01 MED ORDER — ZOLPIDEM TARTRATE 5 MG PO TABS
5.0000 mg | ORAL_TABLET | Freq: Every evening | ORAL | Status: DC | PRN
  Administered 2012-04-01 – 2012-04-02 (×2): 5 mg via ORAL
  Filled 2012-04-01 (×2): qty 1

## 2012-04-01 MED ORDER — ACETAMINOPHEN 325 MG PO TABS: 650.0000 mg | ORAL_TABLET | ORAL | Status: DC | PRN

## 2012-04-01 NOTE — ED Notes (Signed)
Bed:WBH43<BR> Expected date:<BR> Expected time:<BR> Means of arrival:<BR> Comments:<BR>

## 2012-04-01 NOTE — ED Notes (Signed)
Per Pt, Pt was here a month ago for heroine addiction.  Pt had at the time stated he was not using daily and would seek outpatient.  Did not qualify for inpatient treatment.  Now, things have changed.  Claims daily heroine use now.  Had appt with [facility] services and has appt on Monday.  Snorting heroine daily.  Needs medical clearance for [facility] on Monday.  Pt states marijuana use also.  No etoh or other drugs including prescription.

## 2012-04-01 NOTE — ED Provider Notes (Addendum)
History     CSN: [id number]  Arrival date & time 04/01/12  2008   First MD Initiated Contact with Patient 04/01/12 2052      Chief Complaint  Patient presents with  . Drug / Alcohol Assessment    (Consider location/radiation/quality/duration/timing/severity/associated sxs/prior treatment) Patient is a 31 y.o. male presenting with drug/alcohol assessment. The history is provided by the patient and medical records.  Drug / Alcohol Assessment Primary symptoms include no self-injury.   31 year old, male, with history of hepatitis, C. and hair 1 addiction, presents to emergency department requesting detox from heroin.  He states that now.  He is using heroin daily, and he would like to get detoxification.  He has arranged to be treated at [facility] on the following Wednesday, which is 4 days from now.  He wishes to become detoxed before going to the outpatient treatment.  There.  He denies alcohol use.  He denies recent illness.  He has used heroin as recently as today.  Past Medical History  Diagnosis Date  . Hepatitis C virus     History reviewed. No pertinent past surgical history.  Family History  Problem Relation Age of Onset  . Hypertension Other     History  Substance Use Topics  . Smoking status: Current Every Day Smoker -- 1.0 packs/day    Types: Cigarettes  . Smokeless tobacco: Not on file  . Alcohol Use: Yes     Comment: occ      Review of Systems  Psychiatric/Behavioral: Negative for suicidal ideas and self-injury.  All other systems reviewed and are negative.    Allergies  Ceclor and Penicillins  Home Medications   Current Outpatient Rx  Name  Route  Sig  Dispense  Refill  . IBUPROFEN 200 MG PO TABS   Oral   Take 200 mg by mouth every 6 (six) hours as needed. Pain         . OMEGA-3-ACID ETHYL ESTERS 1 G PO CAPS   Oral   Take 2 g by mouth daily.         Marland Kitchen VITAMIN B-12 100 MCG PO TABS   Oral   Take 50 mcg by mouth daily.           BP  127/83  Pulse 100  Temp 98.3 F (36.8 C) (Oral)  Resp 18  Wt 185 lb (83.915 kg)  SpO2 100%  Physical Exam  Nursing note and vitals reviewed. Constitutional: He is oriented to person, place, and time. He appears well-developed and well-nourished. No distress.  HENT:  Head: Normocephalic and atraumatic.  Eyes: Conjunctivae normal and EOM are normal.  Neck: Normal range of motion. Neck supple.  Cardiovascular: Normal rate, regular rhythm and intact distal pulses.   No murmur heard. Pulmonary/Chest: Effort normal and breath sounds normal. No respiratory distress.  Abdominal: Soft. Bowel sounds are normal. He exhibits no distension. There is no tenderness.  Musculoskeletal: Normal range of motion. He exhibits no edema.  Neurological: He is alert and oriented to person, place, and time. No cranial nerve deficit.  Skin: Skin is warm and dry.  Psychiatric: He has a normal mood and affect. His behavior is normal. Judgment and thought content normal.    ED Course  Procedures (including critical care time) heroin addiction.  No evidence of acute intoxication.  We will do a medical clearance examination, and then consult the act for evaluation  Labs Reviewed  URINE RAPID DRUG SCREEN (HOSP PERFORMED) - Abnormal; Notable for the following:  Opiates POSITIVE (*)     Tetrahydrocannabinol POSITIVE (*)     All other components within normal limits  CBC  COMPREHENSIVE METABOLIC PANEL  ETHANOL   No results found.   No diagnosis found.  I spoke with the act team member.  She will evaluate the patient for detox from heroin.  MDM  Heroin addiction        Cheri Guppy, MD 04/01/12 2147  Cheri Guppy, MD 04/01/12 2159

## 2012-04-02 ENCOUNTER — Encounter (HOSPITAL_COMMUNITY): Payer: Self-pay | Admitting: Emergency Medicine

## 2012-04-02 MED ORDER — NICOTINE 21 MG/24HR TD PT24
21.0000 mg | MEDICATED_PATCH | Freq: Once | TRANSDERMAL | Status: AC
  Administered 2012-04-02: 21 mg via TRANSDERMAL
  Filled 2012-04-02 (×2): qty 1

## 2012-04-02 NOTE — ED Notes (Signed)
Pt ambulated to bathroom 

## 2012-04-02 NOTE — ED Notes (Signed)
Patient seen to still have on 2 bracelets and one ring. One of the bracelets and one ring taken off. Cloth bracelet will not come off without cutting and patient says it has been on for 9 months. RN Roanna Epley made aware and patient cleared to leave this one cloth bracelet on. Bag of jewelry locked in locker 40 with patient's belongings.

## 2012-04-02 NOTE — ED Notes (Signed)
Pt calm and cooperative but c/o anxiety.

## 2012-04-02 NOTE — ED Notes (Addendum)
Pt calm

## 2012-04-02 NOTE — ED Provider Notes (Signed)
Pt resting comfortably.  No current issues.  Awaiting placement.  Rolan Bucco, MD 04/02/12 [phone]

## 2012-04-02 NOTE — BH Assessment (Signed)
Assessment Note   GADDIEL CULLENS is an 31 y.o. male who presents voluntarily to [facility] for heroin detox. Pt states he has been accepted into [facility] 28-day residential program starting 04/05/12. Pt reports no withdrawal symptoms yet. He first used heroin at age 53 and has used heroin regularly for 8 years. He has smoked heroin daily for past 2 weeks and used $50 worth 04/01/12. He sts he often used intravenously. He reports smoking THC daily for years. He smoked a quarter of a gram 04/01/12. He says he has a sponsor and attends NA meetings and is ready to get clean. Pt states he is serious about getting clean. Longest amount of clean time is 6 months. He reports dx of ADHD. He endorses depressed mood with loss of interest, isolating and irritability. Pt has a court date 04/10/12 for panhandling.   Axis I: 304.00 Opioid Dependence            304.30 Cannabis Dependence Axis II: Deferred Axis III:  Past Medical History  Diagnosis Date  . Hepatitis C virus    Axis IV: other psychosocial or environmental problems and problems related to social environment Axis V: 41-50 serious symptoms  Past Medical History:  Past Medical History  Diagnosis Date  . Hepatitis C virus     History reviewed. No pertinent past surgical history.  Family History:  Family History  Problem Relation Age of Onset  . Hypertension Other     Social History:  reports that he has been smoking Cigarettes.  He has been smoking about 1 pack per day. He does not have any smokeless tobacco history on file. He reports that he drinks alcohol. He reports that he uses illicit drugs (Marijuana and Heroin).  Additional Social History:  Alcohol / Drug Use Pain Medications: none Prescriptions: see PTA meds lsit Over the Counter: see PTA meds list History of alcohol / drug use?: Yes Longest period of sobriety (when/how long): 6 mos Substance #1 Name of Substance 1: heroin 1 - Age of First Use: 18 1 - Amount (size/oz): varies 1  - Frequency: has been using daily for past 2 weeks 1 - Duration: off and on since age 63 1 - Last Use / Amount: 04/01/12 - $50 worth Substance #2 Name of Substance 2: THC 2 - Age of First Use: 15 2 - Amount (size/oz): quarter gram 2 - Frequency: daily 2 - Duration: years 2 - Last Use / Amount: 03/01/12 - quarter gram  CIWA: CIWA-Ar BP: 122/75 mmHg Pulse Rate: 73  COWS:    Allergies:  Allergies  Allergen Reactions  . Ceclor (Cefaclor) Other (See Comments)    unknown  . Penicillins Other (See Comments)    unknown    Home Medications:  (Not in a hospital admission)  OB/GYN Status:  No LMP for male patient.  General Assessment Data Location of Assessment: [facility] ED Living Arrangements: Other relatives Can pt return to current living arrangement?: Yes Admission Status: Voluntary Is patient capable of signing voluntary admission?: Yes Transfer from: Acute Hospital Referral Source: Self/Family/Friend  Education Status Is patient currently in school?: No Highest grade of school patient has completed: 8  Risk to self Suicidal Ideation: No Suicidal Intent: No Is patient at risk for suicide?: No Suicidal Plan?: No Access to Means: No What has been your use of drugs/alcohol within the last 12 months?: daily use THC, heroin Previous Attempts/Gestures: No How many times?: 0  Other Self Harm Risks: na Triggers for Past Attempts:  (na)  Intentional Self Injurious Behavior: None Family Suicide History: No Recent stressful life event(s): Other (Comment) (substance abuse) Persecutory voices/beliefs?: No Depression: Yes Depression Symptoms: Loss of interest in usual pleasures;Feeling angry/irritable;Isolating Substance abuse history and/or treatment for substance abuse?: Yes Suicide prevention information given to non-admitted patients: Not applicable  Risk to Others Homicidal Ideation: No Thoughts of Harm to Others: No Current Homicidal Intent: No Current Homicidal Plan:  No Access to Homicidal Means: No Identified Victim: na History of harm to others?: No Assessment of Violence: None Noted Violent Behavior Description: pt sts has an assault charge for assault on male Does patient have access to weapons?: No Criminal Charges Pending?: No Does patient have a court date: Yes Court Date: 04/10/12 (panhandling)  Psychosis Hallucinations: None noted Delusions: None noted  Mental Status Report Appear/Hygiene: Other (Comment) (unremarkable) Eye Contact: Good Motor Activity: Freedom of movement Speech: Logical/coherent Level of Consciousness: Alert Mood: Depressed Affect: Appropriate to circumstance Anxiety Level: None Thought Processes: Relevant;Coherent Judgement: Unimpaired Orientation: Situation;Time;Place;Person Obsessive Compulsive Thoughts/Behaviors: None  Cognitive Functioning Concentration: Decreased Memory: Recent Impaired;Remote Intact IQ: Average Insight: Good Impulse Control: Poor Appetite: Fair Weight Loss: 0  Weight Gain: 0  Sleep: No Change Total Hours of Sleep: 8  Vegetative Symptoms: None  ADLScreening [facility] Assessment Services) Patient's cognitive ability adequate to safely complete daily activities?: Yes Patient able to express need for assistance with ADLs?: Yes Independently performs ADLs?: Yes (appropriate for developmental age)  Abuse/Neglect [facility]) Physical Abuse: Denies Verbal Abuse: Denies Sexual Abuse: Denies  Prior Inpatient Therapy Prior Inpatient Therapy: Yes Prior Therapy Dates: detox - twice & SA treatment 6 times (unsure of exact dates) Prior Therapy Facilty/Provider(s): including [facility] Reason for Treatment: detox and SA treatment  Prior Outpatient Therapy Prior Outpatient Therapy: No Prior Therapy Dates: na Prior Therapy Facilty/Provider(s): na Reason for Treatment: na  ADL Screening (condition at time of admission) Patient's cognitive ability adequate to safely complete daily activities?:  Yes Patient able to express need for assistance with ADLs?: Yes Independently performs ADLs?: Yes (appropriate for developmental age) Weakness of Legs: None Weakness of Arms/Hands: None  Home Assistive Devices/Equipment Home Assistive Devices/Equipment: None    Abuse/Neglect Assessment (Assessment to be complete while patient is alone) Physical Abuse: Denies Verbal Abuse: Denies Sexual Abuse: Denies Exploitation of patient/patient's resources: Denies Self-Neglect: Denies Values / Beliefs Cultural Requests During Hospitalization: None Spiritual Requests During Hospitalization: None   Advance Directives (For Healthcare) Advance Directive: Patient does not have advance directive;Patient would not like information    Additional Information 1:1 In Past 12 Months?: No CIRT Risk: No Elopement Risk: No Does patient have medical clearance?: No     Disposition:  Disposition Disposition of Patient: Inpatient treatment program Type of inpatient treatment program: Adult (detox)  On Site Evaluation by:   Reviewed with Physician:     Donnamarie Rossetti P 04/02/2012 4:23 AM

## 2012-04-02 NOTE — ED Notes (Signed)
Phone call from Shanda Bumps was attempted. Informed Shanda Bumps that pt went through all phone call privileges today. Jessica left message stating, "I love you. Please leave a phone call for me tomorrow when I get home at [phone]." Gave message to pt. Pt stated, "Okay."

## 2012-04-02 NOTE — ED Notes (Signed)
Pt up using phone.

## 2012-04-03 MED ORDER — CLONIDINE HCL 0.2 MG PO TABS: 0.1000 mg | ORAL_TABLET | Freq: Two times a day (BID) | ORAL | Status: DC

## 2012-04-03 NOTE — ED Notes (Addendum)
Documented CIWA instead of heroin withdrawal. 

## 2012-04-03 NOTE — ED Notes (Signed)
ACT team at bedside discussing detox questions pt had.

## 2012-04-03 NOTE — ED Notes (Signed)
Documented CIWA instead of heroin withdrawal.

## 2012-05-09 ENCOUNTER — Emergency Department (HOSPITAL_COMMUNITY)
Admission: EM | Admit: 2012-05-09 | Discharge: 2012-05-09 | Discharge status: Against Medical Advice | Payer: Self-pay | Attending: Emergency Medicine | Admitting: Emergency Medicine

## 2012-05-09 ENCOUNTER — Encounter (HOSPITAL_COMMUNITY): Payer: Self-pay | Admitting: Emergency Medicine

## 2012-05-09 DIAGNOSIS — R197 Diarrhea, unspecified: Secondary | ICD-10-CM | POA: Insufficient documentation

## 2012-05-09 DIAGNOSIS — R109 Unspecified abdominal pain: Secondary | ICD-10-CM | POA: Insufficient documentation

## 2012-05-09 HISTORY — DX: Opioid dependence, uncomplicated: F11.20

## 2012-05-09 LAB — CBC WITH DIFFERENTIAL/PLATELET
Basophils Relative: 0 % (ref 0–1)
Hemoglobin: 16.4 g/dL (ref 13.0–17.0)
Lymphs Abs: 1.9 10*3/uL (ref 0.7–4.0)
Monocytes Relative: 10 % (ref 3–12)
Neutro Abs: 6.7 10*3/uL (ref 1.7–7.7)
Neutrophils Relative %: 69 % (ref 43–77)
Platelets: 257 10*3/uL (ref 150–400)
RBC: 5.29 MIL/uL (ref 4.22–5.81)

## 2012-05-09 LAB — COMPREHENSIVE METABOLIC PANEL
ALT: 161 U/L — ABNORMAL HIGH (ref 0–53)
Albumin: 3.9 g/dL (ref 3.5–5.2)
Alkaline Phosphatase: 98 U/L (ref 39–117)
BUN: 14 mg/dL (ref 6–23)
Chloride: 101 mEq/L (ref 96–112)
Glucose, Bld: 118 mg/dL — ABNORMAL HIGH (ref 70–99)
Potassium: 4.1 mEq/L (ref 3.5–5.1)
Sodium: 137 mEq/L (ref 135–145)
Total Bilirubin: 0.3 mg/dL (ref 0.3–1.2)
Total Protein: 7.8 g/dL (ref 6.0–8.3)

## 2012-05-09 LAB — LIPASE, BLOOD: Lipase: 27 U/L (ref 11–59)

## 2012-05-09 NOTE — ED Notes (Signed)
Pt not in [facility] when called

## 2012-05-09 NOTE — ED Notes (Signed)
Pt c/o NVD, abd pain x 10 days.  States that he has "smelly burps".  Hx of hep C and heroin addiction.  States that he googled his sxs and thinks that he has h. Pylori.  States he has lost 10 lbs in the last 10 days.  Used heroin on Saturday.  States that sometimes when he is detoxing, he has these sx.

## 2012-05-12 ENCOUNTER — Emergency Department (HOSPITAL_COMMUNITY)
Admission: EM | Admit: 2012-05-12 | Discharge: 2012-05-13 | Disposition: A | Discharge status: Home / Self Care | Payer: Self-pay | Attending: Emergency Medicine | Admitting: Emergency Medicine

## 2012-05-12 ENCOUNTER — Encounter (HOSPITAL_COMMUNITY): Payer: Self-pay | Admitting: *Deleted

## 2012-05-12 DIAGNOSIS — Z79899 Other long term (current) drug therapy: Secondary | ICD-10-CM | POA: Insufficient documentation

## 2012-05-12 DIAGNOSIS — R1110 Vomiting, unspecified: Secondary | ICD-10-CM | POA: Insufficient documentation

## 2012-05-12 DIAGNOSIS — F17200 Nicotine dependence, unspecified, uncomplicated: Secondary | ICD-10-CM | POA: Insufficient documentation

## 2012-05-12 DIAGNOSIS — R197 Diarrhea, unspecified: Secondary | ICD-10-CM | POA: Insufficient documentation

## 2012-05-12 DIAGNOSIS — Z8619 Personal history of other infectious and parasitic diseases: Secondary | ICD-10-CM | POA: Insufficient documentation

## 2012-05-12 DIAGNOSIS — K2970 Gastritis, unspecified, without bleeding: Secondary | ICD-10-CM | POA: Insufficient documentation

## 2012-05-12 DIAGNOSIS — F1520 Other stimulant dependence, uncomplicated: Secondary | ICD-10-CM | POA: Insufficient documentation

## 2012-05-12 LAB — URINALYSIS, ROUTINE W REFLEX MICROSCOPIC
Glucose, UA: NEGATIVE mg/dL
Hgb urine dipstick: NEGATIVE
Leukocytes, UA: NEGATIVE
Protein, ur: NEGATIVE mg/dL
pH: 7 (ref 5.0–8.0)

## 2012-05-12 LAB — CBC WITH DIFFERENTIAL/PLATELET
Basophils Absolute: 0 10*3/uL (ref 0.0–0.1)
Eosinophils Absolute: 0.4 10*3/uL (ref 0.0–0.7)
HCT: 44.9 % (ref 39.0–52.0)
Lymphocytes Relative: 18 % (ref 12–46)
Monocytes Relative: 9 % (ref 3–12)
Neutrophils Relative %: 70 % (ref 43–77)
Platelets: 244 10*3/uL (ref 150–400)
RDW: 12.9 % (ref 11.5–15.5)
WBC: 11.8 10*3/uL — ABNORMAL HIGH (ref 4.0–10.5)

## 2012-05-12 LAB — COMPREHENSIVE METABOLIC PANEL
ALT: 158 U/L — ABNORMAL HIGH (ref 0–53)
AST: 93 U/L — ABNORMAL HIGH (ref 0–37)
Albumin: 3.7 g/dL (ref 3.5–5.2)
CO2: 28 mEq/L (ref 19–32)
Chloride: 100 mEq/L (ref 96–112)
GFR calc non Af Amer: >90 mL/min (ref >90)
Potassium: 4.4 mEq/L (ref 3.5–5.1)
Sodium: 138 mEq/L (ref 135–145)
Total Bilirubin: 0.4 mg/dL (ref 0.3–1.2)

## 2012-05-12 MED ORDER — SODIUM CHLORIDE 0.9 % IV BOLUS (SEPSIS)
2000.0000 mL | Freq: Once | INTRAVENOUS | Status: AC
  Administered 2012-05-12: 1000 mL via INTRAVENOUS

## 2012-05-12 MED ORDER — SODIUM CHLORIDE 0.9 % IV SOLN
1000.0000 mL | INTRAVENOUS | Status: DC
  Administered 2012-05-12: 1000 mL via INTRAVENOUS

## 2012-05-12 MED ORDER — LORAZEPAM 1 MG PO TABS
1.0000 mg | ORAL_TABLET | Freq: Once | ORAL | Status: AC
  Administered 2012-05-12: 1 mg via ORAL
  Filled 2012-05-12: qty 1

## 2012-05-12 NOTE — ED Notes (Signed)
Patient here with burping, abdominal pain, and vomiting, diarrhea, for the last 2 weeks which occurs in the evening over the past 2 weeks.  Pt reports he has an appetite during the day with no vomiting or diarrhea unless he eats heavy.  Pt reports he has a heroin addiction but doesn't feel like these symptoms are related to that.  He also reports a history of hepatitis C which he is currently not being seen for.  Patient reports his burps are foul smelling.

## 2012-05-12 NOTE — ED Notes (Signed)
Patient given urinal for urine sample

## 2012-05-12 NOTE — ED Notes (Signed)
Pt cannot urinate.  Will try again in 30 minutes. 

## 2012-05-13 MED ORDER — PROMETHAZINE HCL 25 MG PO TABS: 25.0000 mg | ORAL_TABLET | Freq: Four times a day (QID) | ORAL | Status: DC | PRN

## 2012-05-13 MED ORDER — SUCRALFATE 1 G PO TABS: 1.0000 g | ORAL_TABLET | Freq: Four times a day (QID) | ORAL | Status: DC

## 2012-05-13 MED ORDER — ESOMEPRAZOLE MAGNESIUM 20 MG PO CPDR: 20.0000 mg | DELAYED_RELEASE_CAPSULE | Freq: Every day | ORAL | Status: DC

## 2012-05-13 NOTE — ED Provider Notes (Signed)
Medical screening examination/treatment/procedure(s) were performed by non-physician practitioner and as supervising physician I was immediately available for consultation/collaboration.   Jerris Keltz H Lavance Beazer, MD 05/13/12 1502 

## 2012-05-13 NOTE — ED Notes (Signed)
In to d/c pt, pt became very angry, belligerent with RNs Maurine Minister and Bendena at bedside. Cursing at staff, attempted to give discharge instructions pt responded "you need to shut the fuck up". Pt insisting we did nothing for him in the ED, given information to follow up with GI.

## 2012-05-13 NOTE — ED Provider Notes (Signed)
History     CSN: [id number]  Arrival date & time 05/12/12  4098   First MD Initiated Contact with Patient 05/12/12 2122      No chief complaint on file.   (Consider location/radiation/quality/duration/timing/severity/associated sxs/prior treatment) HPI  patient presents to the emergency room for abdominal pain, with vomiting over the last 2 weeks.  Patient, states, that he'll had an episode of vomiting that mainly occurs in the evening.  Patient, states that he has had a generalized abdominal pain.  Patient, states that the pain isn't always there, but comes and goes.  Patient has a history of hepatitis C, but has not sought treatment from a GI Dr. patient denies chest pain, shortness of breath, back, pain, dysuria, fever, weakness, diarrhea, or syncope.  Patient, states that he has not taken any medications prior to arrival.  Patient, is a heroin addict and states, that he relapsed 2 weeks ago. Past Medical History  Diagnosis Date  . Hepatitis C virus   . Heroin addiction     History reviewed. No pertinent past surgical history.  Family History  Problem Relation Age of Onset  . Hypertension Other     History  Substance Use Topics  . Smoking status: Current Every Day Smoker -- 1.0 packs/day    Types: Cigarettes  . Smokeless tobacco: Not on file  . Alcohol Use: Yes     Comment: occ      Review of Systems All other systems negative except as documented in the HPI. All pertinent positives and negatives as reviewed in the HPI.  Allergies  Ceclor and Penicillins  Home Medications   Current Outpatient Rx  Name  Route  Sig  Dispense  Refill  . B COMPLEX-C PO TABS   Oral   Take 1 tablet by mouth daily.         . IBUPROFEN 200 MG PO TABS   Oral   Take 800 mg by mouth every 8 (eight) hours as needed. Pain         . OMEGA-3-ACID ETHYL ESTERS 1 G PO CAPS   Oral   Take 2 g by mouth daily.           BP 112/68  Pulse 106  Temp 97.9 F (36.6 C) (Oral)  Resp  20  Wt 180 lb (81.647 kg)  SpO2 95%  Physical Exam  Nursing note and vitals reviewed. Constitutional: He is oriented to person, place, and time. He appears well-developed and well-nourished. No distress.  HENT:  Head: Normocephalic and atraumatic.  Eyes: Pupils are equal, round, and reactive to light.  Neck: Normal range of motion. Neck supple.  Cardiovascular: Normal rate, regular rhythm and normal heart sounds.   Pulmonary/Chest: Effort normal and breath sounds normal.  Abdominal: Soft. Bowel sounds are normal. He exhibits no distension. There is no tenderness. There is no guarding.  Neurological: He is alert and oriented to person, place, and time.  Skin: Skin is warm and dry.    ED Course  Procedures (including critical care time)  Labs Reviewed  CBC WITH DIFFERENTIAL - Abnormal; Notable for the following:    WBC 11.8 (*)     Neutro Abs 8.2 (*)     Monocytes Absolute 1.1 (*)     All other components within normal limits  COMPREHENSIVE METABOLIC PANEL - Abnormal; Notable for the following:    AST 93 (*)     ALT 158 (*)     All other components within normal limits  URINALYSIS, ROUTINE W REFLEX MICROSCOPIC - Abnormal; Notable for the following:    Ketones, ur TRACE (*)     All other components within normal limits  LIPASE, BLOOD    Patient will be discharged home with GI followup.  Patient has been tolerating oral fluids.  Patient, is advised return here for any worsening in his condition.  Patient's vital signs remained stable MDM   MDM Reviewed: vitals, nursing note and previous chart Reviewed previous: labs Interpretation: labs           Carlyle Dolly, PA-C 05/13/12 0102

## 2012-05-29 ENCOUNTER — Emergency Department (HOSPITAL_COMMUNITY)
Admission: EM | Admit: 2012-05-29 | Discharge: 2012-05-29 | Discharge status: Against Medical Advice | Payer: Self-pay | Attending: Emergency Medicine | Admitting: Emergency Medicine

## 2012-05-29 ENCOUNTER — Encounter (HOSPITAL_COMMUNITY): Payer: Self-pay | Admitting: *Deleted

## 2012-05-29 DIAGNOSIS — K5289 Other specified noninfective gastroenteritis and colitis: Secondary | ICD-10-CM | POA: Insufficient documentation

## 2012-05-29 DIAGNOSIS — K529 Noninfective gastroenteritis and colitis, unspecified: Secondary | ICD-10-CM

## 2012-05-29 DIAGNOSIS — F1110 Opioid abuse, uncomplicated: Secondary | ICD-10-CM | POA: Insufficient documentation

## 2012-05-29 DIAGNOSIS — Z5900 Homelessness unspecified: Secondary | ICD-10-CM | POA: Insufficient documentation

## 2012-05-29 DIAGNOSIS — R238 Other skin changes: Secondary | ICD-10-CM | POA: Insufficient documentation

## 2012-05-29 DIAGNOSIS — F17200 Nicotine dependence, unspecified, uncomplicated: Secondary | ICD-10-CM | POA: Insufficient documentation

## 2012-05-29 DIAGNOSIS — Z8619 Personal history of other infectious and parasitic diseases: Secondary | ICD-10-CM | POA: Insufficient documentation

## 2012-05-29 DIAGNOSIS — R197 Diarrhea, unspecified: Secondary | ICD-10-CM | POA: Insufficient documentation

## 2012-05-29 MED ORDER — ONDANSETRON HCL 4 MG/2ML IJ SOLN: 4.0000 mg | Freq: Once | INTRAMUSCULAR | Status: DC

## 2012-05-29 MED ORDER — SODIUM CHLORIDE 0.9 % IV BOLUS (SEPSIS): 1000.0000 mL | INTRAVENOUS | Status: DC

## 2012-05-29 MED ORDER — PANTOPRAZOLE SODIUM 40 MG IV SOLR: 40.0000 mg | INTRAVENOUS | Status: DC

## 2012-05-29 NOTE — ED Notes (Signed)
Pt reports he is having burping, that develops into n/v. Sts he was seen here for same a few weeks ago and does not believe enough was done. Believes something else is wrong. Sts he is an IV heroin user. Only filled phenergan Rx and not "heartburn" medicine. Sts he has been doing his own "research" on [employer] and believes he has H.Pylori and wants to be checked for that. Pt also reporting "missing" on an IV injection and would like R forearm checked as it is red and swollen at an injection site.

## 2012-05-29 NOTE — ED Notes (Signed)
Refused to stay to sign AMA papers.

## 2012-05-29 NOTE — ED Notes (Signed)
States that he believes that he has H. Pylori and wants to be tested.

## 2012-05-29 NOTE — ED Notes (Signed)
Is very upset because he was told that the testing that he wants is not performed in the ER. Refuses meds and lab to be drawn.

## 2012-05-29 NOTE — ED Provider Notes (Signed)
History     CSN: [id number]  Arrival date & time 05/29/12  1405   First MD Initiated Contact with Patient 05/29/12 1506      Chief Complaint  Patient presents with  . Nausea  . Emesis  . Diarrhea    (Consider location/radiation/quality/duration/timing/severity/associated sxs/prior treatment) HPI Comments: Martin Allen presents ambulatory for evaluation.  Martin Allen reports being seen in the ER recently secondary to NVD.  Martin Allen reports since leaving the ER, the only medication Martin Allen was able to fill was the phenergan.  The symptoms resolved for several days but last night Martin Allen started having the foul smelling burping. This morning Martin Allen again had nausea and vomiting followed by loose stools.  Martin Allen is concerned after researching his symptoms on the internet that Martin Allen may have H. Pylori.  Martin Allen denies weight loss and fever.  Patient is a 32 y.o. male presenting with vomiting and diarrhea. No language interpreter was used.  Emesis  This is a recurrent problem. Episode onset: has started again over the last 24 hours afte having not been present for about 4 to 5 days. The problem occurs continuously. The problem has been gradually worsening. The emesis has an appearance of stomach contents. There has been no fever. Associated symptoms include diarrhea. Pertinent negatives include no abdominal pain, no arthralgias, no chills, no cough, no fever, no myalgias, no sweats and no URI.  Diarrhea The primary symptoms include nausea, vomiting and diarrhea. Primary symptoms do not include fever, fatigue, abdominal pain, dysuria, myalgias, arthralgias or rash.  The illness does not include chills or constipation.    Past Medical History  Diagnosis Date  . Hepatitis C virus   . Heroin addiction     History reviewed. No pertinent past surgical history.  Family History  Problem Relation Age of Onset  . Hypertension Other     History  Substance Use Topics  . Smoking status: Current Every Day Smoker -- 1.0 packs/day    Types:  Cigarettes  . Smokeless tobacco: Not on file  . Alcohol Use: Yes     Comment: occ      Review of Systems  Constitutional: Positive for appetite change. Negative for fever, chills, diaphoresis, activity change and fatigue.  HENT: Negative.   Respiratory: Negative for cough, chest tightness and shortness of breath.   Cardiovascular: Negative for chest pain, palpitations and leg swelling.  Gastrointestinal: Positive for nausea, vomiting and diarrhea. Negative for abdominal pain, constipation and blood in stool.  Genitourinary: Negative for dysuria, hematuria, flank pain, decreased urine volume and difficulty urinating.  Musculoskeletal: Negative for myalgias and arthralgias.  Skin: Positive for color change. Negative for pallor, rash and wound.  Neurological: Negative for dizziness, syncope, weakness and light-headedness.  Psychiatric/Behavioral: Positive for dysphoric mood.    Allergies  Ceclor and Penicillins  Home Medications   Current Outpatient Rx  Name  Route  Sig  Dispense  Refill  . PROMETHAZINE HCL 25 MG PO TABS   Oral   Take 1 tablet (25 mg total) by mouth every 6 (six) hours as needed for nausea.   10 tablet   0   . B COMPLEX-C PO TABS   Oral   Take 1 tablet by mouth daily.         Marland Kitchen ESOMEPRAZOLE MAGNESIUM 20 MG PO CPDR   Oral   Take 1 capsule (20 mg total) by mouth daily before breakfast.   14 capsule   0   . OMEGA-3-ACID ETHYL ESTERS 1 G PO CAPS  Oral   Take 2 g by mouth daily.         . SUCRALFATE 1 G PO TABS   Oral   Take 1 tablet (1 g total) by mouth 4 (four) times daily.   21 tablet   0     BP 133/90  Pulse 87  Temp 97.5 F (36.4 C) (Oral)  Resp 16  SpO2 98%  Physical Exam  Nursing note and vitals reviewed. Constitutional: Martin Allen is oriented to person, place, and time. Martin Allen appears well-developed and well-nourished. No distress.  HENT:  Head: Normocephalic.  Right Ear: External ear normal.  Left Ear: External ear normal.  Nose: Nose  normal.  Mouth/Throat: Oropharynx is clear and moist. No oropharyngeal exudate.  Eyes: Conjunctivae normal are normal. Pupils are equal, round, and reactive to light. Right eye exhibits no discharge. Left eye exhibits no discharge. No scleral icterus.  Neck: Normal range of motion. Neck supple. No JVD present. No tracheal deviation present.  Cardiovascular: Normal rate, regular rhythm, normal heart sounds and intact distal pulses.  Exam reveals no gallop and no friction rub.   No murmur heard. Pulmonary/Chest: Effort normal and breath sounds normal. No stridor. No respiratory distress. Martin Allen has no wheezes. Martin Allen has no rales. Martin Allen exhibits no tenderness.  Abdominal: Soft. Bowel sounds are normal. Martin Allen exhibits no distension and no mass. There is no tenderness. There is no rebound and no guarding.  Musculoskeletal: Normal range of motion. Martin Allen exhibits no edema and no tenderness.  Lymphadenopathy:    Martin Allen has no cervical adenopathy.  Neurological: Martin Allen is alert and oriented to person, place, and time. No cranial nerve deficit.  Skin: Skin is warm and dry. No rash noted. Martin Allen is not diaphoretic. No erythema. No pallor.  Psychiatric: Martin Allen has a normal mood and affect. His behavior is normal.    ED Course  Procedures (including critical care time)   Labs Reviewed  CBC  COMPREHENSIVE METABOLIC PANEL  LIPASE, BLOOD   No results found.   No diagnosis found.    MDM  Pt with known admission of IV drug abuse presents for evaluation of recurrent nausea, vomiting, and diarrhea.  Martin Allen appears nontoxic, note stable VS, NAD.  Martin Allen has an area on his right forearm where Martin Allen states Martin Allen missed the vein 2 days ago and is concerned may be developing into an infection.  Note a small area of very faint redness about the size of a quarter on the right ventral forearm.  There is no erythema or palpable fluctuance.  Ordered basic belly labs and protonix/nexium for symptomatic care while awaiting results.  Martin Allen reports being homeless.   Consulted social work to provide information regarding local homeless shelters.  1610.  Informed by nursing that Martin Allen has left the ER prior to completion of his evaluation.  During my exam, Martin Allen had no reproducible abdominal tenderness.  Martin Allen had no clinical evidence of dehydration.  Martin Allen appeared nontoxic and hemodynamically stable.       Tobin Chad, MD 05/29/12 1620

## 2012-05-29 NOTE — Progress Notes (Signed)
CSW received referral from EDP regarding homelessness. CSW met with pt at bedside to assess patient current needs. CSW introduced self and CSW role. Pt stated, "I appreicate you coming by, however I am aware of all the resources and I even told the doctor that I didn't want a social working coming here." CSW acknowledged patient's feelings and offered the list of homeless resources and patient thanked csw. CSW attempted to inquire regarding patient current situation, and patient stated again, "I really appreciate you coming by but I do not wish to talk further about this." CSW informed EDP of patient declining further csw services and support.   .No further Clinical Social Work needs, signing off. Please call CSW for any further csw needs.   Catha Gosselin, LCSWA  [phone] .05/29/2012 1556pm

## 2012-06-16 ENCOUNTER — Encounter (HOSPITAL_COMMUNITY): Payer: Self-pay | Admitting: Emergency Medicine

## 2012-06-16 ENCOUNTER — Emergency Department (HOSPITAL_COMMUNITY)
Admission: EM | Admit: 2012-06-16 | Discharge: 2012-06-17 | Disposition: A | Discharge status: Home / Self Care | Payer: Self-pay | Attending: Emergency Medicine | Admitting: Emergency Medicine

## 2012-06-16 DIAGNOSIS — B1920 Unspecified viral hepatitis C without hepatic coma: Secondary | ICD-10-CM | POA: Insufficient documentation

## 2012-06-16 DIAGNOSIS — F3289 Other specified depressive episodes: Secondary | ICD-10-CM | POA: Insufficient documentation

## 2012-06-16 DIAGNOSIS — F1120 Opioid dependence, uncomplicated: Secondary | ICD-10-CM | POA: Insufficient documentation

## 2012-06-16 DIAGNOSIS — F329 Major depressive disorder, single episode, unspecified: Secondary | ICD-10-CM | POA: Insufficient documentation

## 2012-06-16 DIAGNOSIS — F17200 Nicotine dependence, unspecified, uncomplicated: Secondary | ICD-10-CM | POA: Insufficient documentation

## 2012-06-16 DIAGNOSIS — F1420 Cocaine dependence, uncomplicated: Secondary | ICD-10-CM | POA: Insufficient documentation

## 2012-06-16 DIAGNOSIS — F1220 Cannabis dependence, uncomplicated: Secondary | ICD-10-CM | POA: Insufficient documentation

## 2012-06-16 HISTORY — DX: Major depressive disorder, single episode, unspecified: F32.9

## 2012-06-16 HISTORY — DX: Depression, unspecified: F32.A

## 2012-06-16 HISTORY — DX: Mental disorder, not otherwise specified: F99

## 2012-06-16 LAB — CBC
HCT: 45 % (ref 39.0–52.0)
Hemoglobin: 15.6 g/dL (ref 13.0–17.0)
MCH: 31.3 pg (ref 26.0–34.0)
MCHC: 34.7 g/dL (ref 30.0–36.0)
RBC: 4.99 MIL/uL (ref 4.22–5.81)

## 2012-06-16 LAB — COMPREHENSIVE METABOLIC PANEL
ALT: 272 U/L — ABNORMAL HIGH (ref 0–53)
Alkaline Phosphatase: 105 U/L (ref 39–117)
BUN: 16 mg/dL (ref 6–23)
CO2: 29 mEq/L (ref 19–32)
GFR calc Af Amer: >90 mL/min (ref >90)
GFR calc non Af Amer: >90 mL/min (ref >90)
Glucose, Bld: 117 mg/dL — ABNORMAL HIGH (ref 70–99)
Potassium: 3.5 mEq/L (ref 3.5–5.1)
Sodium: 133 mEq/L — ABNORMAL LOW (ref 135–145)
Total Protein: 7.9 g/dL (ref 6.0–8.3)

## 2012-06-16 LAB — ACETAMINOPHEN LEVEL: Acetaminophen (Tylenol), Serum: <15 ug/mL (ref 10–30)

## 2012-06-16 LAB — ETHANOL: Alcohol, Ethyl (B): <11 mg/dL (ref 0–11)

## 2012-06-16 LAB — RAPID URINE DRUG SCREEN, HOSP PERFORMED: Barbiturates: NOT DETECTED

## 2012-06-16 MED ORDER — HYDROXYZINE HCL 25 MG PO TABS
25.0000 mg | ORAL_TABLET | Freq: Four times a day (QID) | ORAL | Status: DC | PRN
  Administered 2012-06-16: 25 mg via ORAL
  Filled 2012-06-16: qty 1

## 2012-06-16 MED ORDER — CLONIDINE HCL 0.1 MG PO TABS: 0.1000 mg | ORAL_TABLET | ORAL | Status: DC

## 2012-06-16 MED ORDER — ZOLPIDEM TARTRATE 5 MG PO TABS
5.0000 mg | ORAL_TABLET | Freq: Every evening | ORAL | Status: DC | PRN
  Administered 2012-06-16: 5 mg via ORAL
  Filled 2012-06-16: qty 1

## 2012-06-16 MED ORDER — NICOTINE 21 MG/24HR TD PT24
21.0000 mg | MEDICATED_PATCH | Freq: Every day | TRANSDERMAL | Status: DC
  Administered 2012-06-16: 21 mg via TRANSDERMAL
  Filled 2012-06-16 (×2): qty 1

## 2012-06-16 MED ORDER — ONDANSETRON HCL 4 MG PO TABS: 4.0000 mg | ORAL_TABLET | Freq: Three times a day (TID) | ORAL | Status: DC | PRN

## 2012-06-16 MED ORDER — LOPERAMIDE HCL 2 MG PO CAPS: 2.0000 mg | ORAL_CAPSULE | ORAL | Status: DC | PRN

## 2012-06-16 MED ORDER — DICYCLOMINE HCL 20 MG PO TABS: 20.0000 mg | ORAL_TABLET | Freq: Four times a day (QID) | ORAL | Status: DC | PRN

## 2012-06-16 MED ORDER — METHOCARBAMOL 500 MG PO TABS: 500.0000 mg | ORAL_TABLET | Freq: Three times a day (TID) | ORAL | Status: DC | PRN

## 2012-06-16 MED ORDER — ONDANSETRON 4 MG PO TBDP: 4.0000 mg | ORAL_TABLET | Freq: Four times a day (QID) | ORAL | Status: DC | PRN

## 2012-06-16 MED ORDER — CLONIDINE HCL 0.1 MG PO TABS
0.1000 mg | ORAL_TABLET | Freq: Four times a day (QID) | ORAL | Status: DC
  Administered 2012-06-16 – 2012-06-17 (×2): 0.1 mg via ORAL
  Filled 2012-06-16 (×2): qty 1

## 2012-06-16 MED ORDER — CLONIDINE HCL 0.1 MG PO TABS: 0.1000 mg | ORAL_TABLET | Freq: Every day | ORAL | Status: DC

## 2012-06-16 MED ORDER — IBUPROFEN 600 MG PO TABS: 600.0000 mg | ORAL_TABLET | Freq: Three times a day (TID) | ORAL | Status: DC | PRN

## 2012-06-16 MED ORDER — NAPROXEN 500 MG PO TABS: 500.0000 mg | ORAL_TABLET | Freq: Two times a day (BID) | ORAL | Status: DC | PRN

## 2012-06-16 MED ORDER — LORAZEPAM 1 MG PO TABS
1.0000 mg | ORAL_TABLET | Freq: Once | ORAL | Status: AC
  Administered 2012-06-16: 1 mg via ORAL
  Filled 2012-06-16: qty 1

## 2012-06-16 NOTE — BH Assessment (Signed)
Assessment Note   Martin Allen is a 32 y.o. male presenting to emerg dept for detox from Heroin, Marijuana and Cocaine.  Pt denies SI/HI/Psych.  Pt is using substances daily, last use was 06/16/12.  Pt using 1/2 gram of Heroin, shooting in bilateral arms; 1/2 Quarter of THC and $40 worth of Cocaine(tells this Clinical research associate that he uses cocaine wkly).  Pt has long hx of drug use and has past into admissions with [facility](2012) and [facility](2013).  Pt admits that he was asked to leave both programs due to his behavior.  Pt has no complaints of w/d sxs at present.  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  Current legal issues: Leisa Lenz, court date 07/03/12 and Shoplifting chg, court date 07/2012.  Pt denies problems with seizures/blackouts.  Axis I: Polysub Dependence  Axis II: Deferred Axis III:  Past Medical History  Diagnosis Date  . Hepatitis C virus   . Heroin addiction    Axis IV: economic problems, other psychosocial or environmental problems, problems related to legal system/crime, problems related to social environment and problems with primary support group Axis V: 51-60 moderate symptoms  Past Medical History:  Past Medical History  Diagnosis Date  . Hepatitis C virus   . Heroin addiction     History reviewed. No pertinent past surgical history.  Family History:  Family History  Problem Relation Age of Onset  . Hypertension Other     Social History:  reports that he has been smoking Cigarettes.  He has been smoking about 1 pack per day. He does not have any smokeless tobacco history on file. He reports that he drinks alcohol. He reports that he uses illicit drugs (Marijuana, Heroin, and Cocaine).  Additional Social History:  Alcohol / Drug Use Pain Medications: None  Prescriptions: None  Over the Counter: None  History of alcohol / drug use?: Yes Longest period of sobriety (when/how long): None  Negative  Consequences of Use: Legal;Personal relationships;Financial Withdrawal Symptoms: Other (Comment) (No w/d sxs ar present ) Substance #1 Name of Substance 1: Heroin--DOC  1 - Age of First Use: 44 YOM  1 - Amount (size/oz): 1/2 Gram  1 - Frequency: Daily  1 - Duration: On-going  1 - Last Use / Amount: 06/16/12 Substance #2 Name of Substance 2: THC  2 - Age of First Use: 15 YOM  2 - Amount (size/oz): 1/2 Quarter  2 - Frequency: Daily  2 - Duration: On-going  2 - Last Use / Amount: 06/16/12 Substance #3 Name of Substance 3: Cocaine 3 - Age of First Use: Teens  3 - Amount (size/oz): $40 3 - Frequency: Wkly  3 - Duration: On-going  3 - Last Use / Amount: 06/16/12  CIWA: CIWA-Ar BP: 132/76 mmHg Pulse Rate: 67  Nausea and Vomiting: no nausea and no vomiting Tactile Disturbances: none Tremor: no tremor Auditory Disturbances: not present Paroxysmal Sweats: no sweat visible Visual Disturbances: not present Anxiety: no anxiety, at ease Headache, Fullness in Head: none present Agitation: normal activity Orientation and Clouding of Sensorium: oriented and can do serial additions CIWA-Ar Total: 0  COWS: Clinical Opiate Withdrawal Scale (COWS) Resting Pulse Rate: Pulse Rate 80 or below Sweating: No report of chills or flushing Restlessness: Able to sit still Pupil Size: Pupils pinned or normal size for room light Bone or Joint Aches: Not present Runny Nose or Tearing: Not present GI Upset: No GI symptoms Tremor: No tremor  Yawning: No yawning Anxiety or Irritability: None Gooseflesh Skin: Skin is smooth COWS Total Score: 0   Allergies:  Allergies  Allergen Reactions  . Ceclor (Cefaclor) Other (See Comments)    unknown  . Penicillins Other (See Comments)    unknown    Home Medications:  (Not in a hospital admission)  OB/GYN Status:  No LMP for male patient.  General Assessment Data Location of Assessment: [facility] Living Arrangements: Alone Can pt return to current  living arrangement?: Yes Admission Status: Voluntary Is patient capable of signing voluntary admission?: Yes Transfer from: Acute Hospital Referral Source: MD  Education Status Is patient currently in school?: No Current Grade: None  Highest grade of school patient has completed: None  Name of school: None  Contact person: None   Risk to self Suicidal Ideation: No Suicidal Intent: No Is patient at risk for suicide?: No Suicidal Plan?: No Access to Means: No What has been your use of drugs/alcohol within the last 12 months?: Abusing: Heroin, THC, Cocaine  Previous Attempts/Gestures: No How many times?: 0  Other Self Harm Risks: None  Triggers for Past Attempts: None known Intentional Self Injurious Behavior: None Family Suicide History: No Recent stressful life event(s): Other (Comment);Legal Issues;Financial Problems (Chronic SA ) Persecutory voices/beliefs?: No Depression: Yes Depression Symptoms: Feeling angry/irritable Substance abuse history and/or treatment for substance abuse?: Yes Suicide prevention information given to non-admitted patients: Not applicable  Risk to Others Homicidal Ideation: No Thoughts of Harm to Others: No Current Homicidal Intent: No Current Homicidal Plan: No Access to Homicidal Means: No Identified Victim: None  History of harm to others?: No Assessment of Violence: None Noted Violent Behavior Description: None  Does patient have access to weapons?: No Criminal Charges Pending?: Yes Describe Pending Criminal Charges: [place]; Shoplifting  Does patient have a court date: Yes Court Date: 07/03/12 (Addt'l court date 07-2012)  Psychosis Hallucinations: None noted Delusions: None noted  Mental Status Report Appear/Hygiene: Disheveled Eye Contact: Fair Motor Activity: Unremarkable Speech: Logical/coherent Level of Consciousness: Irritable;Alert Mood: Irritable Affect: Irritable Anxiety Level: None Thought Processes:  Coherent;Relevant Judgement: Unimpaired Orientation: Person;Place;Time;Situation Obsessive Compulsive Thoughts/Behaviors: None  Cognitive Functioning Concentration: Normal Memory: Recent Intact;Remote Intact IQ: Average Insight: Fair Impulse Control: Fair Appetite: Good Weight Loss: 0  Weight Gain: 0  Sleep: No Change Total Hours of Sleep: 8  Vegetative Symptoms: None  ADLScreening [facility] Assessment Services) Patient's cognitive ability adequate to safely complete daily activities?: Yes Patient able to express need for assistance with ADLs?: Yes Independently performs ADLs?: Yes (appropriate for developmental age)  Abuse/Neglect [facility]) Physical Abuse: Denies Verbal Abuse: Denies Sexual Abuse: Denies  Prior Inpatient Therapy Prior Inpatient Therapy: Yes Prior Therapy Dates: 2013,2012 Prior Therapy Facilty/Provider(s): [facility], [facility]  Reason for Treatment: Detox/Rehab   Prior Outpatient Therapy Prior Outpatient Therapy: No Prior Therapy Dates: None  Prior Therapy Facilty/Provider(s): None  Reason for Treatment: None   ADL Screening (condition at time of admission) Patient's cognitive ability adequate to safely complete daily activities?: Yes Patient able to express need for assistance with ADLs?: Yes Independently performs ADLs?: Yes (appropriate for developmental age) Weakness of Legs: None Weakness of Arms/Hands: None  Home Assistive Devices/Equipment Home Assistive Devices/Equipment: None  Therapy Consults (therapy consults require a physician order) PT Evaluation Needed: No OT Evalulation Needed: No SLP Evaluation Needed: No Abuse/Neglect Assessment (Assessment to be complete while patient is alone) Physical Abuse: Denies Verbal Abuse: Denies Sexual Abuse: Denies Exploitation of patient/patient's resources: Denies Self-Neglect: Denies Values / Beliefs Cultural Requests During  Hospitalization: None Spiritual Requests During Hospitalization:  None Consults Spiritual Care Consult Needed: No Social Work Consult Needed: No Merchant navy officer (For Healthcare) Advance Directive: Patient does not have advance directive;Patient would not like information Pre-existing out of facility DNR order (yellow form or pink MOST form): No Nutrition Screen- [facility] Patient's home diet: Regular Have you recently lost weight without trying?: No Have you been eating poorly because of a decreased appetite?: No Malnutrition Screening Tool Score: 0   Additional Information 1:1 In Past 12 Months?: No CIRT Risk: No Elopement Risk: No Does patient have medical clearance?: Yes     Disposition:  Disposition Disposition of Patient: Inpatient treatment program;Referred to ([facility] ) Type of inpatient treatment program: Adult Patient referred to: [facility]  On Site Evaluation by:   Reviewed with Physician:     Murrell Redden 06/16/2012 10:34 PM

## 2012-06-16 NOTE — ED Notes (Signed)
Patient wanded and placed in paper scrubs.

## 2012-06-16 NOTE — ED Provider Notes (Signed)
History     CSN: [id number]  Arrival date & time 06/16/12  1605   First MD Initiated Contact with Patient 06/16/12 1641      Chief Complaint  Patient presents with  . Medical Clearance    (Consider location/radiation/quality/duration/timing/severity/associated sxs/prior treatment) HPI This 32 year old male has a history of hepatitis C as well as IV drug abuse, he is here requesting detox for his heroin addiction. He denies any threats or himself or others and denies any psychosis. He currently is no abdominal pain vomiting or diarrhea but is actually had nightly vomiting and diarrhea with intermittent abdominal pain over the last month but it is not present today. He is no chest pain cough or shortness breath. He is no confusion. He is no trauma. There is no treatment prior to arrival. His never been through a formal detox program. He did not contact [facility] today PTA. Past Medical History  Diagnosis Date  . Hepatitis C virus   . Heroin addiction   . Mental disorder   . Depression     History reviewed. No pertinent past surgical history.  Family History  Problem Relation Age of Onset  . Hypertension Other     History  Substance Use Topics  . Smoking status: Current Every Day Smoker -- 1.0 packs/day    Types: Cigarettes  . Smokeless tobacco: Not on file  . Alcohol Use: Yes     Comment: occ      Review of Systems 10 Systems reviewed and are negative for acute change except as noted in the HPI. Allergies  Ceclor and Penicillins  Home Medications  No current outpatient prescriptions on file.  BP 113/75  Pulse 80  Temp 98.4 F (36.9 C) (Oral)  Resp 20  Wt 180 lb (81.647 kg)  SpO2 98%  Physical Exam  Nursing note and vitals reviewed. Constitutional:       Awake, alert, nontoxic appearance with baseline speech for patient.  HENT:  Head: Atraumatic.  Mouth/Throat: No oropharyngeal exudate.  Eyes: EOM are normal. Pupils are equal, round, and reactive to light.  Right eye exhibits no discharge. Left eye exhibits no discharge.  Neck: Neck supple.  Cardiovascular: Normal rate and regular rhythm.   No murmur heard. Pulmonary/Chest: Effort normal and breath sounds normal. No stridor. No respiratory distress. He has no wheezes. He has no rales. He exhibits no tenderness.  Abdominal: Soft. Bowel sounds are normal. He exhibits no mass. There is no tenderness. There is no rebound.  Musculoskeletal: He exhibits no tenderness.       Baseline ROM, moves extremities with no obvious new focal weakness.  Lymphadenopathy:    He has no cervical adenopathy.  Neurological: He is alert.       Awake, alert, cooperative and aware of situation; motor strength bilaterally; sensation normal to light touch bilaterally; peripheral visual fields full to confrontation; no facial asymmetry; tongue midline; major cranial nerves appear intact; no pronator drift, normal finger to nose bilaterally, baseline gait without new ataxia.  Skin: No rash noted.  Psychiatric: He has a normal mood and affect.    ED Course  Procedures (including critical care time) SW or [facility] to contact [facility] where Pt might get bed. 2100  Labs Reviewed  COMPREHENSIVE METABOLIC PANEL - Abnormal; Notable for the following:    Sodium 133 (*)     Chloride 95 (*)     Glucose, Bld 117 (*)     AST 133 (*)     ALT 272 (*)  All other components within normal limits  SALICYLATE LEVEL - Abnormal; Notable for the following:    Salicylate Lvl <2.0 (*)     All other components within normal limits  URINE RAPID DRUG SCREEN (HOSP PERFORMED) - Abnormal; Notable for the following:    Opiates POSITIVE (*)     Cocaine POSITIVE (*)     Tetrahydrocannabinol POSITIVE (*)     All other components within normal limits  ACETAMINOPHEN LEVEL  CBC  ETHANOL  LAB REPORT - SCANNED   No results found.   1. Narcotic addiction   2. Hepatitis C       MDM  Patient / Family / Caregiver understand and agree with initial  ED impression and plan with expectations set for ED visit.        Hurman Horn, MD 06/19/12 [phone]

## 2012-06-16 NOTE — ED Notes (Signed)
Patient here voluntarily for medical clearance.  His detox is set up through [facility].  Pt is detoxing from heroin, which he last injected at 12 noon today.  He used a total of 1/2 gram today.

## 2012-06-16 NOTE — ED Notes (Signed)
Pt up to call grandfather back and use bathroom

## 2012-06-16 NOTE — ED Notes (Signed)
Pt reports he called [facility] two days ago and they told him they had open beds and to go to the ER for medical clearance. Pt didn't come to ER until today. Pt asked if someone has called [facility] to see if they still have beds available and if they will be setting him up for transfer provided he's medically cleared. Advised that [facility] CSW is working on this for him as quickly as they can. Pt reports his grandfather will take him to [facility].

## 2012-06-17 ENCOUNTER — Encounter (HOSPITAL_COMMUNITY): Payer: Self-pay | Admitting: *Deleted

## 2012-06-17 NOTE — [facility] Counselor (Signed)
Patient accepted to [facility] pending a authorization number provide by [employer]. Writer contacted [employer] and completed a screening. Following the completion of the screening a authorization # was obtained. The # is [id number] approved by staff member-Bill.

## 2012-06-17 NOTE — ED Provider Notes (Signed)
  Physical Exam  BP 91/57  Pulse 63  Temp 98.2 F (36.8 C) (Oral)  Resp 16  Wt 180 lb (81.647 kg)  SpO2 98%  Physical Exam  ED Course  Procedures  MDM Patient has been seen and evaluated, he is here for detox from heroin. He has no withdrawal symptoms at this time. He has been accepted at [facility] with his insurance pending. I anticipate that he will leave this morning.  On my exam he is sleeping, arousable, follows commands, no significant depression or suicidal thoughts, soft abdomen with clear heart and lung sounds. Vital signs have remained stable, his last blood pressure measurement was slightly hypotensive but the patient is sleeping and not having any symptoms. He does appear stable for discharge to substance abuse center.      Vida Roller, MD 06/17/12 [phone]

## 2012-06-17 NOTE — ED Notes (Signed)
Dr Miller into see 

## 2012-06-17 NOTE — ED Notes (Signed)
Up to the desk to call for a ride 

## 2012-06-17 NOTE — ED Notes (Signed)
Pt ambulatory to dc window w/o difficulty, belongings returned after leaving the area. Pt's grandfather is here to take pt to [facility].

## 2012-06-17 NOTE — ED Notes (Signed)
Martin Allen at [facility] called. Did not require report. Gave her most recent set of vital signs and the meds given this AM- clonidine 0.1mg . Grandfather here to transport to [facility].escorted out by staff. Ambulatory with no complaints voiced.

## 2013-06-04 ENCOUNTER — Emergency Department: Payer: Self-pay | Admitting: Emergency Medicine

## 2013-06-04 LAB — URINALYSIS, COMPLETE
Bilirubin,UR: NEGATIVE
Blood: NEGATIVE
Glucose,UR: NEGATIVE mg/dL (ref 0–75)
Ketone: NEGATIVE
Leukocyte Esterase: NEGATIVE
NITRITE: NEGATIVE
Ph: 5 (ref 4.5–8.0)
Protein: NEGATIVE
RBC,UR: 1 /HPF (ref 0–5)
Specific Gravity: 1.02 (ref 1.003–1.030)
WBC UR: 1 /HPF (ref 0–5)

## 2013-06-04 LAB — COMPREHENSIVE METABOLIC PANEL
ALK PHOS: 93 U/L
ALT: 183 U/L — AB (ref 12–78)
ANION GAP: 8 (ref 7–16)
AST: 87 U/L — AB (ref 15–37)
Albumin: 3.7 g/dL (ref 3.4–5.0)
BILIRUBIN TOTAL: 0.3 mg/dL (ref 0.2–1.0)
BUN: 13 mg/dL (ref 7–18)
CALCIUM: 8.9 mg/dL (ref 8.5–10.1)
Chloride: 104 mmol/L (ref 98–107)
Co2: 23 mmol/L (ref 21–32)
Creatinine: 1.02 mg/dL (ref 0.60–1.30)
EGFR (African American): >60
Glucose: 107 mg/dL — ABNORMAL HIGH (ref 65–99)
Osmolality: 271 (ref 275–301)
Potassium: 3.3 mmol/L — ABNORMAL LOW (ref 3.5–5.1)
Sodium: 135 mmol/L — ABNORMAL LOW (ref 136–145)
Total Protein: 7.9 g/dL (ref 6.4–8.2)

## 2013-06-04 LAB — CBC
HCT: 47.3 % (ref 40.0–52.0)
HGB: 16.1 g/dL (ref 13.0–18.0)
MCH: 31 pg (ref 26.0–34.0)
MCHC: 34.1 g/dL (ref 32.0–36.0)
MCV: 91 fL (ref 80–100)
Platelet: 196 10*3/uL (ref 150–440)
RBC: 5.21 10*6/uL (ref 4.40–5.90)
RDW: 13 % (ref 11.5–14.5)
WBC: 9.9 10*3/uL (ref 3.8–10.6)

## 2013-06-04 LAB — DRUG SCREEN, URINE
Amphetamines, Ur Screen: POSITIVE (ref ?–1000)
BARBITURATES, UR SCREEN: NEGATIVE (ref ?–200)
Benzodiazepine, Ur Scrn: NEGATIVE (ref ?–200)
Cannabinoid 50 Ng, Ur ~~LOC~~: POSITIVE (ref ?–50)
Cocaine Metabolite,Ur ~~LOC~~: NEGATIVE (ref ?–300)
MDMA (ECSTASY) UR SCREEN: NEGATIVE (ref ?–500)
Methadone, Ur Screen: NEGATIVE (ref ?–300)
Opiate, Ur Screen: POSITIVE (ref ?–300)
PHENCYCLIDINE (PCP) UR S: NEGATIVE (ref ?–25)
TRICYCLIC, UR SCREEN: NEGATIVE (ref ?–1000)

## 2013-06-04 LAB — SALICYLATE LEVEL: Salicylates, Serum: 1.9 mg/dL

## 2013-06-04 LAB — ACETAMINOPHEN LEVEL: Acetaminophen: <2 ug/mL

## 2013-06-04 LAB — ETHANOL
ETHANOL %: 0.011 % (ref 0.000–0.080)
Ethanol: 11 mg/dL

## 2013-06-05 LAB — DRUG SCREEN, URINE
Amphetamines, Ur Screen: POSITIVE (ref ?–1000)
Barbiturates, Ur Screen: NEGATIVE (ref ?–200)
Benzodiazepine, Ur Scrn: NEGATIVE (ref ?–200)
COCAINE METABOLITE, UR ~~LOC~~: NEGATIVE (ref ?–300)
Cannabinoid 50 Ng, Ur ~~LOC~~: POSITIVE (ref ?–50)
MDMA (Ecstasy)Ur Screen: NEGATIVE (ref ?–500)
Methadone, Ur Screen: NEGATIVE (ref ?–300)
Opiate, Ur Screen: POSITIVE (ref ?–300)
Phencyclidine (PCP) Ur S: NEGATIVE (ref ?–25)
Tricyclic, Ur Screen: NEGATIVE (ref ?–1000)

## 2013-08-23 ENCOUNTER — Emergency Department (HOSPITAL_COMMUNITY): Payer: Self-pay

## 2013-08-23 ENCOUNTER — Inpatient Hospital Stay (HOSPITAL_COMMUNITY)
Admission: EM | Admit: 2013-08-23 | Discharge: 2013-08-25 | DRG: 188 | Discharge status: Against Medical Advice | Payer: Self-pay | Attending: Internal Medicine | Admitting: Internal Medicine

## 2013-08-23 ENCOUNTER — Encounter (HOSPITAL_COMMUNITY): Payer: Self-pay | Admitting: Emergency Medicine

## 2013-08-23 DIAGNOSIS — F17200 Nicotine dependence, unspecified, uncomplicated: Secondary | ICD-10-CM | POA: Diagnosis present

## 2013-08-23 DIAGNOSIS — J90 Pleural effusion, not elsewhere classified: Principal | ICD-10-CM | POA: Diagnosis present

## 2013-08-23 DIAGNOSIS — Z5900 Homelessness unspecified: Secondary | ICD-10-CM

## 2013-08-23 DIAGNOSIS — J939 Pneumothorax, unspecified: Secondary | ICD-10-CM | POA: Diagnosis present

## 2013-08-23 DIAGNOSIS — R071 Chest pain on breathing: Secondary | ICD-10-CM | POA: Diagnosis present

## 2013-08-23 DIAGNOSIS — Z720 Tobacco use: Secondary | ICD-10-CM

## 2013-08-23 DIAGNOSIS — B1920 Unspecified viral hepatitis C without hepatic coma: Secondary | ICD-10-CM

## 2013-08-23 DIAGNOSIS — R7989 Other specified abnormal findings of blood chemistry: Secondary | ICD-10-CM | POA: Diagnosis present

## 2013-08-23 DIAGNOSIS — F1210 Cannabis abuse, uncomplicated: Secondary | ICD-10-CM | POA: Diagnosis present

## 2013-08-23 DIAGNOSIS — J9383 Other pneumothorax: Secondary | ICD-10-CM

## 2013-08-23 DIAGNOSIS — F1910 Other psychoactive substance abuse, uncomplicated: Secondary | ICD-10-CM

## 2013-08-23 DIAGNOSIS — D72829 Elevated white blood cell count, unspecified: Secondary | ICD-10-CM | POA: Diagnosis present

## 2013-08-23 DIAGNOSIS — F1120 Opioid dependence, uncomplicated: Secondary | ICD-10-CM | POA: Diagnosis present

## 2013-08-23 DIAGNOSIS — F1410 Cocaine abuse, uncomplicated: Secondary | ICD-10-CM | POA: Diagnosis present

## 2013-08-23 LAB — RAPID URINE DRUG SCREEN, HOSP PERFORMED
AMPHETAMINES: NOT DETECTED
BENZODIAZEPINES: NOT DETECTED
Barbiturates: NOT DETECTED
COCAINE: POSITIVE — AB
OPIATES: NOT DETECTED
Tetrahydrocannabinol: POSITIVE — AB

## 2013-08-23 LAB — ETHANOL: Alcohol, Ethyl (B): <11 mg/dL (ref 0–11)

## 2013-08-23 LAB — ACETAMINOPHEN LEVEL: Acetaminophen (Tylenol), Serum: <15 ug/mL (ref 10–30)

## 2013-08-23 LAB — CBC
HCT: 42.8 % (ref 39.0–52.0)
Hemoglobin: 15.2 g/dL (ref 13.0–17.0)
MCH: 32 pg (ref 26.0–34.0)
MCHC: 35.5 g/dL (ref 30.0–36.0)
MCV: 90.1 fL (ref 78.0–100.0)
PLATELETS: 241 10*3/uL (ref 150–400)
RBC: 4.75 MIL/uL (ref 4.22–5.81)
RDW: 12.9 % (ref 11.5–15.5)
WBC: 13.7 10*3/uL — AB (ref 4.0–10.5)

## 2013-08-23 LAB — COMPREHENSIVE METABOLIC PANEL
ALBUMIN: 3.8 g/dL (ref 3.5–5.2)
ALT: 110 U/L — ABNORMAL HIGH (ref 0–53)
AST: 60 U/L — AB (ref 0–37)
Alkaline Phosphatase: 103 U/L (ref 39–117)
BUN: 13 mg/dL (ref 6–23)
CALCIUM: 9.4 mg/dL (ref 8.4–10.5)
CHLORIDE: 101 meq/L (ref 96–112)
CO2: 26 mEq/L (ref 19–32)
CREATININE: 1.08 mg/dL (ref 0.50–1.35)
GFR calc Af Amer: >90 mL/min (ref >90)
GFR calc non Af Amer: >90 mL/min (ref >90)
Glucose, Bld: 94 mg/dL (ref 70–99)
Potassium: 4.3 mEq/L (ref 3.7–5.3)
Sodium: 138 mEq/L (ref 137–147)
TOTAL PROTEIN: 7.5 g/dL (ref 6.0–8.3)
Total Bilirubin: 0.3 mg/dL (ref 0.3–1.2)

## 2013-08-23 LAB — I-STAT TROPONIN, ED: Troponin i, poc: 0 ng/mL (ref 0.00–0.08)

## 2013-08-23 LAB — D-DIMER, QUANTITATIVE: D-Dimer, Quant: 0.27 ug/mL-FEU (ref 0.00–0.48)

## 2013-08-23 LAB — SALICYLATE LEVEL

## 2013-08-23 MED ORDER — ONDANSETRON HCL 4 MG PO TABS: 4.0000 mg | ORAL_TABLET | Freq: Four times a day (QID) | ORAL | Status: DC | PRN

## 2013-08-23 MED ORDER — METHOCARBAMOL 500 MG PO TABS: 500.0000 mg | ORAL_TABLET | Freq: Three times a day (TID) | ORAL | Status: DC | PRN

## 2013-08-23 MED ORDER — CLONIDINE HCL 0.1 MG PO TABS: 0.1000 mg | ORAL_TABLET | Freq: Every day | ORAL | Status: DC

## 2013-08-23 MED ORDER — DICYCLOMINE HCL 20 MG PO TABS
20.0000 mg | ORAL_TABLET | Freq: Four times a day (QID) | ORAL | Status: DC | PRN
  Filled 2013-08-23: qty 1

## 2013-08-23 MED ORDER — ONDANSETRON 4 MG PO TBDP
4.0000 mg | ORAL_TABLET | Freq: Four times a day (QID) | ORAL | Status: DC | PRN
  Filled 2013-08-23: qty 1

## 2013-08-23 MED ORDER — CLONIDINE HCL 0.1 MG PO TABS
0.1000 mg | ORAL_TABLET | Freq: Four times a day (QID) | ORAL | Status: DC
  Administered 2013-08-23 – 2013-08-24 (×4): 0.1 mg via ORAL
  Filled 2013-08-23 (×9): qty 1

## 2013-08-23 MED ORDER — ACETAMINOPHEN 650 MG RE SUPP: 650.0000 mg | Freq: Four times a day (QID) | RECTAL | Status: DC | PRN

## 2013-08-23 MED ORDER — CLONIDINE HCL 0.1 MG PO TABS: 0.1000 mg | ORAL_TABLET | ORAL | Status: DC

## 2013-08-23 MED ORDER — HYDROXYZINE HCL 25 MG PO TABS
25.0000 mg | ORAL_TABLET | Freq: Four times a day (QID) | ORAL | Status: DC | PRN
  Filled 2013-08-23: qty 1

## 2013-08-23 MED ORDER — SODIUM CHLORIDE 0.9 % IV SOLN: INTRAVENOUS | Status: AC

## 2013-08-23 MED ORDER — NAPROXEN 500 MG PO TABS
500.0000 mg | ORAL_TABLET | Freq: Two times a day (BID) | ORAL | Status: DC | PRN
  Filled 2013-08-23: qty 1

## 2013-08-23 MED ORDER — ACETAMINOPHEN 325 MG PO TABS: 650.0000 mg | ORAL_TABLET | Freq: Four times a day (QID) | ORAL | Status: DC | PRN

## 2013-08-23 MED ORDER — LOPERAMIDE HCL 2 MG PO CAPS: 2.0000 mg | ORAL_CAPSULE | ORAL | Status: DC | PRN

## 2013-08-23 MED ORDER — ONDANSETRON HCL 4 MG/2ML IJ SOLN: 4.0000 mg | Freq: Four times a day (QID) | INTRAMUSCULAR | Status: DC | PRN

## 2013-08-23 MED ORDER — SODIUM CHLORIDE 0.9 % IJ SOLN
3.0000 mL | Freq: Two times a day (BID) | INTRAMUSCULAR | Status: DC
  Administered 2013-08-23 – 2013-08-24 (×2): 3 mL via INTRAVENOUS

## 2013-08-23 NOTE — ED Provider Notes (Signed)
CSN: [id number]     Arrival date & time 08/23/13  1926 History   First MD Initiated Contact with Patient 08/23/13 1933     Chief Complaint  Patient presents with  . Medical Clearance     (Consider location/radiation/quality/duration/timing/severity/associated sxs/prior Treatment) HPI 33 year old male with a history of IV drug abuse polysubstance abuse heroin addiction cocaine use methamphetamine use requests detox, he has had several days of constant right-sided sharp stabbing pleuritic nonexertional chest pain which started while he was shooting up methamphetamine and cocaine and getting high with no fever no cough no shortness breath no abdominal pain no vomiting no sweats no depression no suicidal ideation no homicidal ideation no hallucinations no treatment prior to arrival. His last detox was earlier this year and he was clean for perhaps a few weeks before he started using drugs again. Chest pain was severe and sudden but is now almost gone and mild. Past Medical History  Diagnosis Date  . Hepatitis C virus   . Heroin addiction   . Mental disorder   . Depression    History reviewed. No pertinent past surgical history. Family History  Problem Relation Age of Onset  . Hypertension Other    History  Substance Use Topics  . Smoking status: Current Every Day Smoker -- 1.00 packs/day    Types: Cigarettes  . Smokeless tobacco: Not on file  . Alcohol Use: Yes     Comment: occ    Review of Systems 10 Systems reviewed and are negative for acute change except as noted in the HPI.   Allergies  Ceclor and Penicillins  Home Medications  No current outpatient prescriptions on file. BP 118/55  Pulse 60  Temp(Src) 97.5 F (36.4 C) (Oral)  Resp 20  Ht 6\' 1"  (1.854 m)  Wt 191 lb (86.637 kg)  BMI 25.20 kg/m2  SpO2 99% Physical Exam  Nursing note and vitals reviewed. Constitutional:  Awake, alert, nontoxic appearance.  HENT:  Head: Atraumatic.  Eyes: Right eye exhibits no  discharge. Left eye exhibits no discharge.  Neck: Neck supple.  Cardiovascular: Regular rhythm.   No murmur heard. Tachycardic  Pulmonary/Chest: Effort normal and breath sounds normal. No respiratory distress. He has no wheezes. He has no rales. He exhibits no tenderness.  Abdominal: Soft. Bowel sounds are normal. He exhibits no distension. There is no tenderness. There is no rebound and no guarding.  Musculoskeletal: He exhibits no edema and no tenderness.  Baseline ROM, no obvious new focal weakness.  Neurological: He is alert.  Mental status and motor strength appears baseline for patient and situation.  Skin: No rash noted.  Psychiatric: He has a normal mood and affect.    ED Course  Procedures (including critical care time) D/w PCCM who recs 100% O2 mask overnight and repeat CXR in AM and Obs [facility] tonight, Pt aware. 2140  Pt stable in ED with no significant deterioration in condition.Patient informed of clinical course, understand medical decision-making process, and agree with plan. [facility] aware Pt will have Med Obs and [facility] can consult [facility] in AM if deemed medically cleared for Psych.   Labs Review Labs Reviewed  CBC - Abnormal; Notable for the following:    WBC 13.7 (*)    All other components within normal limits  COMPREHENSIVE METABOLIC PANEL - Abnormal; Notable for the following:    AST 60 (*)    ALT 110 (*)    All other components within normal limits  SALICYLATE LEVEL - Abnormal; Notable for the  following:    Salicylate Lvl <2.0 (*)    All other components within normal limits  URINE RAPID DRUG SCREEN (HOSP PERFORMED) - Abnormal; Notable for the following:    Cocaine POSITIVE (*)    Tetrahydrocannabinol POSITIVE (*)    All other components within normal limits  COMPREHENSIVE METABOLIC PANEL - Abnormal; Notable for the following:    Glucose, Bld 109 (*)    Albumin 3.2 (*)    AST 49 (*)    ALT 92 (*)    Total Bilirubin <0.2 (*)    All other components within  normal limits  ACETAMINOPHEN LEVEL  ETHANOL  D-DIMER, QUANTITATIVE  CBC  CK  TROPONIN I  URINALYSIS, ROUTINE W REFLEX MICROSCOPIC  I-STAT TROPOININ, ED   Imaging Review Dg Chest 2 View  08/24/2013   CLINICAL DATA:  Evaluate pneumothorax  EXAM: CHEST  2 VIEW  COMPARISON:  DG CHEST 2 VIEW dated 08/23/2013; DG THORACIC SPINE dated 08/10/2008  FINDINGS: Grossly unchanged cardiac silhouette and mediastinal contours. The amount of air within the previously identified small to moderate-sized right-sided hydro pneumothorax is grossly unchanged though there is suspected minimal increase in the amount of fluid component. There is no significant deviation of the cardiomediastinal structures. Worsening right infrahilar heterogeneous/consolidative opacities. No evidence of edema. Unchanged bones.  IMPRESSION: 1. Minimal increase in the amount of fluid within an otherwise unchanged small to moderate sized right-sided hydro pneumothorax. 2. Worsening of right middle lobe atelectasis.   Electronically Signed   By: Simonne Come M.D.   On: 08/24/2013 08:17   Dg Chest 2 View  08/23/2013   CLINICAL DATA:  Medical clearance  EXAM: CHEST  2 VIEW  COMPARISON:  08/10/2008  FINDINGS: Cardiomediastinal silhouette is stable. No acute infiltrate or pulmonary edema. There is about 20-25% right pneumothorax.  IMPRESSION: No acute infiltrate or pulmonary edema. There is about 20-25% right pneumothorax.  Critical Value/emergent results were called by telephone at the time of interpretation on 08/23/2013 at 8:05 PM to Dr. Wayland Salinas , who verbally acknowledged these results.   Electronically Signed   By: Natasha Mead M.D.   On: 08/23/2013 20:06   Dg Chest Port 1 View  08/24/2013   CLINICAL DATA:  Pneumothorax  EXAM: PORTABLE CHEST - 1 VIEW  COMPARISON:  Study obtained earlier in the day  FINDINGS: The right-sided pneumothorax is essentially stable compared to earlier in the day. There is no appreciable tension component. Elsewhere, lungs  are clear. Heart size and pulmonary vascularity are normal. No adenopathy. No appreciable bone lesions.  IMPRESSION: Stable pneumothorax on the right without appreciable tension component. Lungs elsewhere clear.   Electronically Signed   By: Bretta Bang M.D.   On: 08/24/2013 15:44     EKG Interpretation   Date/Time:  Thursday August 23 2013 20:43:08 EDT Ventricular Rate:  103 PR Interval:  159 QRS Duration: 116 QT Interval:  354 QTC Calculation: 463 R Axis:   67 Text Interpretation:  Sinus tachycardia Incomplete right bundle branch  block ST elevation suggests acute pericarditis No significant change since  last tracing Confirmed by [facility]  MD, Jonny Ruiz ([id number]) on 08/23/2013 8:46:47 PM      MDM   Final diagnoses:  Pneumothorax  Polysubstance abuse  IV drug abuse  Hepatitis C    The patient appears reasonably stabilized for admission considering the current resources, flow, and capabilities available in the ED at this time, and I doubt any other [facility] requiring further screening and/or treatment in the ED  prior to admission.    Hurman Horn, MD 08/24/13 [phone]

## 2013-08-23 NOTE — ED Notes (Signed)
Pt states he is here for detox from heroin and cocaine  Pt states uses daily and has been using for the past 10 years  Last use was about an hour ago

## 2013-08-23 NOTE — ED Notes (Signed)
MD at bedside. 

## 2013-08-23 NOTE — H&P (Signed)
[facility] History and Physical  Martin Allen WUJ:[id number] DOB: 03/12/1981 DOA: 08/23/2013  Referring physician: ER physician. PCP: No primary provider on file.   Chief Complaint: Drug detox.  HPI: Martin Allen is a 33 y.o. male and history of polysubstance abuse and hepatitis C had come to the ER for drug detox. In addition patient complained of right-sided chest pain pleuritic in nature. Patient states that on Sunday 4 days ago when patient was having cocaine and methamphetamine started nothing acutely right-sided chest pain which was pleuritic in nature and initially was severe. Following which patient's chest pain slowly improved but still persisted. Denies any fever chills cough or any dizziness or loss of consciousness. Today patient had come for drug detox when he mentioned about the chest pain. Chest x-ray shows right-sided pneumothorax 20-30%. On-call pulmonary critical care Dr. Kendrick Fries was consulted and at this time they have requested admission and get repeat chest x-ray in a.m. and place patient on 100% nonrebreather. Patient otherwise denies any nausea vomiting abdominal pain focal deficits diarrhea.   Review of Systems: As presented in the history of presenting illness, rest negative.  Past Medical History  Diagnosis Date  . Hepatitis C virus   . Heroin addiction   . Mental disorder   . Depression    History reviewed. No pertinent past surgical history. Social History:  reports that he has been smoking Cigarettes.  He has been smoking about 1.00 pack per day. He does not have any smokeless tobacco history on file. He reports that he drinks alcohol. He reports that he uses illicit drugs (Marijuana, Heroin, Cocaine, and Methamphetamines). Where does patient live homeless. Can patient participate in ADLs? Yes.  Allergies  Allergen Reactions  . Ceclor [Cefaclor] Other (See Comments)    unknown  . Penicillins Other (See Comments)    unknown    Family History:   Family History  Problem Relation Age of Onset  . Hypertension Other       Prior to Admission medications   Not on File    Physical Exam: Filed Vitals:   08/23/13 1935 08/23/13 2200  BP: 134/87 104/88  Pulse: 122 95  Temp: 98.5 F (36.9 C)   TempSrc: Oral   Resp: 18 24  SpO2: 97% 100%     General:  Well-developed and nourished.  Eyes: Anicteric no pallor.  ENT: No discharge from ears eyes nose mouth.  Neck: No mass felt.  Cardiovascular: S1-S2 heard.  Respiratory: No rhonchi or crepitations. Bilateral air entry present.  Abdomen: Soft nontender bowel sounds present.  Skin: Face looked flushed.  Musculoskeletal: No edema.  Psychiatric: Patient is not suicidal.  Neurologic: Alert awake oriented to time place and person. Moves all extremities.  Labs on Admission:  Basic Metabolic Panel:  Recent Labs Lab 08/23/13 2000  NA 138  K 4.3  CL 101  CO2 26  GLUCOSE 94  BUN 13  CREATININE 1.08  CALCIUM 9.4   Liver Function Tests:  Recent Labs Lab 08/23/13 2000  AST 60*  ALT 110*  ALKPHOS 103  BILITOT 0.3  PROT 7.5  ALBUMIN 3.8   No results found for this basename: LIPASE, AMYLASE,  in the last 168 hours No results found for this basename: AMMONIA,  in the last 168 hours CBC:  Recent Labs Lab 08/23/13 2000  WBC 13.7*  HGB 15.2  HCT 42.8  MCV 90.1  PLT 241   Cardiac Enzymes: No results found for this basename: CKTOTAL, CKMB, CKMBINDEX, TROPONINI,  in the last 168 hours  BNP (last 3 results) No results found for this basename: PROBNP,  in the last 8760 hours CBG: No results found for this basename: GLUCAP,  in the last 168 hours  Radiological Exams on Admission: Dg Chest 2 View  08/23/2013   CLINICAL DATA:  Medical clearance  EXAM: CHEST  2 VIEW  COMPARISON:  08/10/2008  FINDINGS: Cardiomediastinal silhouette is stable. No acute infiltrate or pulmonary edema. There is about 20-25% right pneumothorax.  IMPRESSION: No acute infiltrate or  pulmonary edema. There is about 20-25% right pneumothorax.  Critical Value/emergent results were called by telephone at the time of interpretation on 08/23/2013 at 8:05 PM to Dr. Wayland SalinasJOHN BEDNAR , who verbally acknowledged these results.   Electronically Signed   By: Natasha MeadLiviu  Pop M.D.   On: 08/23/2013 20:06    EKG: Independently reviewed. Sinus tachycardia with incomplete right bundle branch block.  Assessment/Plan Principal Problem:   Pneumothorax Active Problems:   Polysubstance abuse   Hepatitis C   Leucocytosis   1. Spontaneous pneumothorax right-sided - as advised pulmonologist patient has been placed on 100% nonrebreather and a repeat chest x-ray has been ordered in a.m. Patient presently is not in acute distress. Closely observe in telemetry. Reconsult pulmonologist in a.m. 2. Polysubstance abuse - patient has been placed on opiate withdrawal protocol. Closely observe. 3. Leukocytosis - probably reactionary. Patient is afebrile. Check UA. 4. History of hepatitis C and elevated LFTs - follow LFTs for now. Patient will need hepatitis C treatment eventually.    Code Status: Full code.  Family Communication: None.  Disposition Plan: Admit to inpatient.    KAKRAKANDY,ARSHAD N. [facility] Pager [phone].  If 7PM-7AM, please contact night-coverage [email or web address] Password [email or web address] 08/23/2013, 11:31 PM

## 2013-08-24 ENCOUNTER — Inpatient Hospital Stay (HOSPITAL_COMMUNITY): Payer: Self-pay

## 2013-08-24 ENCOUNTER — Inpatient Hospital Stay (HOSPITAL_COMMUNITY): Payer: MEDICAID

## 2013-08-24 DIAGNOSIS — F17200 Nicotine dependence, unspecified, uncomplicated: Secondary | ICD-10-CM

## 2013-08-24 DIAGNOSIS — F1920 Other psychoactive substance dependence, uncomplicated: Secondary | ICD-10-CM

## 2013-08-24 LAB — URINE MICROSCOPIC-ADD ON

## 2013-08-24 LAB — URINALYSIS, ROUTINE W REFLEX MICROSCOPIC
Bilirubin Urine: NEGATIVE
GLUCOSE, UA: NEGATIVE mg/dL
Hgb urine dipstick: NEGATIVE
Ketones, ur: NEGATIVE mg/dL
Leukocytes, UA: NEGATIVE
Nitrite: NEGATIVE
Protein, ur: NEGATIVE mg/dL
Specific Gravity, Urine: 1.02 (ref 1.005–1.030)
Urobilinogen, UA: 1 mg/dL (ref 0.0–1.0)
pH: 8 (ref 5.0–8.0)

## 2013-08-24 LAB — COMPREHENSIVE METABOLIC PANEL
ALT: 92 U/L — ABNORMAL HIGH (ref 0–53)
AST: 49 U/L — ABNORMAL HIGH (ref 0–37)
Albumin: 3.2 g/dL — ABNORMAL LOW (ref 3.5–5.2)
Alkaline Phosphatase: 100 U/L (ref 39–117)
BUN: 14 mg/dL (ref 6–23)
CO2: 26 mEq/L (ref 19–32)
Calcium: 8.5 mg/dL (ref 8.4–10.5)
Chloride: 108 mEq/L (ref 96–112)
Creatinine, Ser: 1 mg/dL (ref 0.50–1.35)
GFR calc Af Amer: >90 mL/min (ref >90)
GFR calc non Af Amer: >90 mL/min (ref >90)
Glucose, Bld: 109 mg/dL — ABNORMAL HIGH (ref 70–99)
POTASSIUM: 3.7 meq/L (ref 3.7–5.3)
Sodium: 143 mEq/L (ref 137–147)
Total Bilirubin: <0.2 mg/dL — ABNORMAL LOW (ref 0.3–1.2)
Total Protein: 6.2 g/dL (ref 6.0–8.3)

## 2013-08-24 LAB — CBC
HCT: 40.6 % (ref 39.0–52.0)
HEMOGLOBIN: 13.7 g/dL (ref 13.0–17.0)
MCH: 30.6 pg (ref 26.0–34.0)
MCHC: 33.7 g/dL (ref 30.0–36.0)
MCV: 90.6 fL (ref 78.0–100.0)
Platelets: 194 10*3/uL (ref 150–400)
RBC: 4.48 MIL/uL (ref 4.22–5.81)
RDW: 13.1 % (ref 11.5–15.5)
WBC: 8.1 10*3/uL (ref 4.0–10.5)

## 2013-08-24 LAB — CK: Total CK: 154 U/L (ref 7–232)

## 2013-08-24 LAB — TROPONIN I: Troponin I: <0.3 ng/mL (ref <0.30)

## 2013-08-24 MED ORDER — BOOST PLUS PO LIQD
237.0000 mL | Freq: Two times a day (BID) | ORAL | Status: DC
  Filled 2013-08-24 (×3): qty 237

## 2013-08-24 NOTE — Progress Notes (Signed)
Clinical Social Work Department CLINICAL SOCIAL WORK PSYCHIATRY SERVICE LINE ASSESSMENT 08/24/2013  Patient:  Martin Allen  Account:  [id number]  Admit Date:  08/23/2013  Clinical Social Worker:  Unk Lightning, LCSW  Date/Time:  08/24/2013 03:45 PM Referred by:  Physician  Date referred:  08/24/2013 Reason for Referral  Substance Abuse   Presenting Symptoms/Problems (In the person's/family's own words):   Psych consulted due to substance abuse.   Abuse/Neglect/Trauma History (check all that apply)  Emotional abuse   Abuse/Neglect/Trauma Comments:   Patient reports he ran away from home several times around the age of 33 due to discovering marijuana in his mom's room. Patient felt he had a difficult childhood.   Psychiatric History (check all that apply)  Residential treatment   Psychiatric medications:  None   Current Mental Health Hospitalizations/Previous Mental Health History:   Patient reports he was diagnosed with bipolar when he was 33 years old but does not believe that this is true diagnosis. Patient does not have any current treatment for bipolar and is not on any medications.   Current provider:   None   Place and Date:   N/A   Current Medications:   Scheduled Meds:      . cloNIDine  0.1 mg Oral QID   Followed by     . [START ON 08/26/2013] cloNIDine  0.1 mg Oral BH-qamhs   Followed by     . [START ON 08/29/2013] cloNIDine  0.1 mg Oral QAC breakfast  . lactose free nutrition  237 mL Oral BID BM  . sodium chloride  3 mL Intravenous Q12H        Continuous Infusions:      . sodium chloride           PRN Meds:.acetaminophen, acetaminophen, dicyclomine, hydrOXYzine, loperamide, methocarbamol, naproxen, ondansetron (ZOFRAN) IV, ondansetron, ondansetron       Previous Impatient Admission/Date/Reason:   Patient has been to [facility], [facility], and a rehab facility in [place] in the past.   Emotional Health / Current Symptoms    Suicide/Self Harm  None reported   Suicide  attempt in the past:   Patient denies any attempts and denies any SI or HI.   Other harmful behavior:   None reported   Psychotic/Dissociative Symptoms  None reported   Other Psychotic/Dissociative Symptoms:    Attention/Behavioral Symptoms  Within Normal Limits   Other Attention / Behavioral Symptoms:   Patient engaged during assessment.    Cognitive Impairment  Within Normal Limits   Other Cognitive Impairment:   Patient alert and oriented.    Mood and Adjustment  Mood Congruent    Stress, Anxiety, Trauma, Any Recent Loss/Stressor  Current Legal Problems/Pending Court Date   Anxiety (frequency):   N/A   Phobia (specify):   N/A   Compulsive behavior (specify):   N/A   Obsessive behavior (specify):   N/A   Other:   Patient has upcoming court date on 09/03/13.   Substance Abuse/Use  Current substance use   SBIRT completed (please refer for detailed history):  Y  Self-reported substance use:   Patient reports he has been using substances for about 12 years. Patient states that he has been sober for 6 months in 2010. Patient has been to treatment and is open to treatment again. Patient currently uses heroin, cocaine, and marijuana. Patient reports that consumption varies.   Urinary Drug Screen Completed:  Y Alcohol level:   <11    Environmental/Housing/Living Arrangement  HOMELESS   Who  is in the home:   Patient has been staying with a friend for the past 6 weeks.   Emergency contact:  [place]   Patient's Strengths and Goals (patient's own words):   Patient reports that he speaks with his grandfather on a daily basis. Patient is open to treatment and wants to remain sober.   Clinical Social Worker's Interpretive Summary:   CSW received referral in order to complete psychosocial assessment. CSW reviewed chart and met with patient at bedside. CSW introduced myself and explained role.    Patient reports that he came  to the ED in hopes of getting detox and remaining sober. Patient reports a long history of drug use and reports that he has been using less heroin but still using cocaine and marijuana. Patient reports that he is motivated to get sober so that he can work a full-time job and have more stability.    Patient is currently homeless and has been staying with friends and at shelters. Patient reports that his grandfather is involved and that family lives nearby but he has "burned most of those bridges". Patient reports that he has been to [facility] and is worried that he will not be accepted to [facility] due to previous behaviors when there at facility. Patient completed SBIRT and agreeable to discuss treatment options.    Patient reports that he is unsure if he wants to go to another facility for detox. Patient reports he will talk with psych MD about plans at [facility] but is agreeable to discuss residential treatment with CSW. CSW called [facility] who has a long waiting list and [facility] did not answer the phone. Patient signed ROI for [facility] and CSW spoke with admissions (Melissa) who reports patient can return if he wants treatment. CSW faxed referral and will continue to follow.    Patient engaged in assessment and agreeable to plans. Patient thanked CSW for time and reports he is motivated to stay sober. Patient wants to talk with psych MD and will decide if he wants further detox. Patient does not have a plan on where to stay until a bed is available at [facility] and reports he will talk with friends and family.    CSW will continue to follow and assist with psych MD recommendations.   Disposition:  Recommend Psych CSW continuing to support while in hospital  [place], [place] [phone]

## 2013-08-24 NOTE — BH Assessment (Signed)
Tele Assessment Note   Martin Allen is a 33 y.o. male who presents to [facility] for Heroin and Cocaine detox.  Pt denies SI/HI/AVH.  Pt reports the following:  Pt uses 1/2 gram of heroin, daily.  His last use was 08/22/13, he used $20 worth of Heroin.  Pt also uses a varied amount of cocaine, daily, last use was 08/22/13, pt used 1/2 gram of cocaine.  Pt shoots drugs in his right arm, has some bruising present.  Pt has previous detox hx in 2015 with [facility]  And [facility].  Pt he immediately started using once he was d/c from [facility] in Feb 2015.  When asked about stressors exacerbating his relapse, he told this Clinical research associate: financial and homelessness.  Pt c/o chest pain and when he breathes in--pt has a spontaneous pneumothorax(collapsed lung). Per Dr. Fonnie Jarvis, pt will be placed on medical floor(telemetry) and examined by [facility] for final medical disposition.  No further action required by [facility].     Axis I: Cocaine use disorder, Severe; Opioid use disorder, Severe Axis II: Deferred Axis III:  Past Medical History  Diagnosis Date  . Hepatitis C virus   . Heroin addiction   . Mental disorder   . Depression    Axis IV: economic problems, housing problems, occupational problems, other psychosocial or environmental problems, problems related to social environment and problems with primary support group Axis V: 41-50 serious symptoms  Past Medical History:  Past Medical History  Diagnosis Date  . Hepatitis C virus   . Heroin addiction   . Mental disorder   . Depression     History reviewed. No pertinent past surgical history.  Family History:  Family History  Problem Relation Age of Onset  . Hypertension Other     Social History:  reports that he has been smoking Cigarettes.  He has been smoking about 1.00 pack per day. He does not have any smokeless tobacco history on file. He reports that he drinks alcohol. He reports that he uses illicit drugs (Marijuana, Heroin, Cocaine, and  Methamphetamines).  Additional Social History:  Alcohol / Drug Use Pain Medications: None  Prescriptions: None  Over the Counter: None  History of alcohol / drug use?: Yes Longest period of sobriety (when/how long): When in detox/rehab  Negative Consequences of Use: Work / School;Personal relationships;Financial Withdrawal Symptoms: Irritability Substance #1 Name of Substance 1: Heroin  1 - Age of First Use: 20's  1 - Amount (size/oz): 1/2 Gram  1 - Frequency: Daily  1 - Duration: On-going  1 - Last Use / Amount: 08/22/13 Substance #2 Name of Substance 2: Cocaine  2 - Age of First Use: Teens  2 - Amount (size/oz): Varies  2 - Frequency: Daily  2 - Duration: On-going  2 - Last Use / Amount: 08/22/13  CIWA: CIWA-Ar BP: 116/62 mmHg Pulse Rate: 78 Nausea and Vomiting: no nausea and no vomiting Tactile Disturbances: very mild itching, pins and needles, burning or numbness Tremor: no tremor Auditory Disturbances: not present Paroxysmal Sweats: barely perceptible sweating, palms moist Visual Disturbances: not present Anxiety: no anxiety, at ease Headache, Fullness in Head: none present Agitation: somewhat more than normal activity Orientation and Clouding of Sensorium: oriented and can do serial additions CIWA-Ar Total: 3 COWS:    Allergies:  Allergies  Allergen Reactions  . Ceclor [Cefaclor] Other (See Comments)    unknown  . Penicillins Other (See Comments)    unknown    Home Medications:  No prescriptions prior to  admission    OB/GYN Status:  No LMP for male patient.  General Assessment Data Location of Assessment: [facility] Is this a Tele or Face-to-Face Assessment?: Face-to-Face Is this an Initial Assessment or a Re-assessment for this encounter?: Initial Assessment Living Arrangements: Other (Comment) (Homeless ) Can pt return to current living arrangement?: Yes Admission Status: Voluntary Is patient capable of signing voluntary admission?: Yes Transfer  from: Acute Hospital Referral Source: MD  Medical Screening Exam [facility] Walk-in ONLY) Medical Exam completed: No Reason for MSE not completed: Other: (None )  [facility] Living Arrangements: Other (Comment) (Homeless ) Name of Psychiatrist: None  Name of Therapist: None   Education Status Is patient currently in school?: No Current Grade: None  Highest grade of school patient has completed: None  Name of school: None  Contact person: None   Risk to self Suicidal Ideation: No Suicidal Intent: No Is patient at risk for suicide?: No Suicidal Plan?: No Access to Means: No What has been your use of drugs/alcohol within the last 12 months?: Abusing: heroin, cocaine  Previous Attempts/Gestures: No How many times?: 0 Other Self Harm Risks: None  Triggers for Past Attempts: None known Intentional Self Injurious Behavior: None Family Suicide History: No Recent stressful life event(s): Job Loss;Financial Problems;Other (Comment);Recent negative physical changes (SA/Homelessness/Collapsed Lung(08/23/13)) Persecutory voices/beliefs?: No Depression: Yes Depression Symptoms: Feeling angry/irritable;Loss of interest in usual pleasures Substance abuse history and/or treatment for substance abuse?: Yes Suicide prevention information given to non-admitted patients: Not applicable  Risk to Others Homicidal Ideation: No Thoughts of Harm to Others: No Current Homicidal Intent: No Current Homicidal Plan: No Access to Homicidal Means: No Identified Victim: None  History of harm to others?: No Assessment of Violence: None Noted Violent Behavior Description: None  Does patient have access to weapons?: No Criminal Charges Pending?: No Does patient have a court date: No  Psychosis Hallucinations: None noted Delusions: None noted  Mental Status Report Appear/Hygiene: Disheveled Eye Contact: Fair Motor Activity: Unremarkable Speech: Logical/coherent Level of Consciousness:  Alert Mood: Irritable Affect: Irritable Anxiety Level: None Thought Processes: Coherent;Relevant Judgement: Unimpaired Orientation: Person;Place;Time;Situation Obsessive Compulsive Thoughts/Behaviors: None  Cognitive Functioning Concentration: Normal Memory: Recent Intact;Remote Intact IQ: Average Insight: Good Impulse Control: Good Appetite: Fair Weight Loss: 0 Weight Gain: 0 Sleep: Decreased Total Hours of Sleep: 4 Vegetative Symptoms: None  ADLScreening [facility] Assessment Services) Patient's cognitive ability adequate to safely complete daily activities?: Yes Patient able to express need for assistance with ADLs?: Yes Independently performs ADLs?: Yes (appropriate for developmental age)  Prior Inpatient Therapy Prior Inpatient Therapy: Yes Prior Therapy Dates: 2015 Prior Therapy Facilty/Provider(s): [facility]  Reason for Treatment: detox/rehab   Prior Outpatient Therapy Prior Outpatient Therapy: No Prior Therapy Dates: None  Prior Therapy Facilty/Provider(s): None  Reason for Treatment: None   ADL Screening (condition at time of admission) Patient's cognitive ability adequate to safely complete daily activities?: Yes Is the patient deaf or have difficulty hearing?: No Does the patient have difficulty seeing, even when wearing glasses/contacts?: No Does the patient have difficulty concentrating, remembering, or making decisions?: No Patient able to express need for assistance with ADLs?: Yes Does the patient have difficulty dressing or bathing?: No Independently performs ADLs?: Yes (appropriate for developmental age) Does the patient have difficulty walking or climbing stairs?: No Weakness of Legs: None Weakness of Arms/Hands: None  Home Assistive Devices/Equipment Home Assistive Devices/Equipment: None  Therapy Consults (therapy consults require a physician order) PT Evaluation Needed: No OT Evalulation Needed: No SLP  Evaluation Needed: No Abuse/Neglect  Assessment (Assessment to be complete while patient is alone) Physical Abuse: Denies Verbal Abuse: Denies Sexual Abuse: Denies Exploitation of patient/patient's resources: Denies Self-Neglect: Denies Values / Beliefs Cultural Requests During Hospitalization: None Spiritual Requests During Hospitalization: None Consults Spiritual Care Consult Needed: No Social Work Consult Needed: No Merchant navy officer (For Healthcare) Advance Directive: Patient does not have advance directive;Patient would not like information Pre-existing out of facility DNR order (yellow form or pink MOST form): No Nutrition Screen- [facility] Patient's home diet: Regular Have you recently lost weight without trying?: No If yes, how much weight have you lost?: Patient is unsure Have you been eating poorly because of a decreased appetite?: No Malnutrition Screening Tool Score: 2  Additional Information 1:1 In Past 12 Months?: No CIRT Risk: No Elopement Risk: No Does patient have medical clearance?: Yes     Disposition:  Disposition Initial Assessment Completed for this Encounter: Yes Disposition of Patient: Referred to (Pt d/c to the med floor--telemetry for 24 obs ) Patient referred to: Other (Comment) (Pt d/c to med floor--telemetry for 24 obs )  Murrell Redden 08/24/2013 12:05 AM

## 2013-08-24 NOTE — Consult Note (Signed)
Name: Durene CalMichael P Jacobowitz MRN: [id number] DOB: [birth year]    ADMISSION DATE:  08/23/2013 CONSULTATION DATE:  08/24/2013  REFERRING MD :  Kathlen ModyVijaya Akula  CHIEF COMPLAINT:  Chest pain  BRIEF PATIENT DESCRIPTION:  33 yo smoker with hx of polysubstance abuse presented to ER for detox and Rt sided pleuritic chest pain present for 4 days prior to admission.  Found to have Rt sided PTX and PCCM consulted.  SIGNIFICANT EVENTS: 4/02 Admit 4/03 PCCM consult  STUDIES:  4/02 UDS >> cocaine, THC  LINES / TUBES: PIV  HISTORY OF PRESENT ILLNESS:   33 yo male developed Rt sided chest pain 4 days prior to admission.  Presented to ED for drug detox.  Had CXR in ER and found to have Rt pneumothorax.  Has since been placed on NRB mask, but was not able to tolerate this.  He has since been on room air.  He still has chest discomfort when he takes a deep breath, but is no longer having pain when he breaths normally.  He feels like he has improved since admission last night.  PAST MEDICAL HISTORY :  Past Medical History  Diagnosis Date  . Hepatitis C virus   . Heroin addiction   . Mental disorder   . Depression    History reviewed. No pertinent past surgical history.  Prior to Admission medications   Not on File   Allergies  Allergen Reactions  . Ceclor [Cefaclor] Other (See Comments)    unknown  . Penicillins Other (See Comments)    unknown    FAMILY HISTORY:  Family History  Problem Relation Age of Onset  . Hypertension Other    SOCIAL HISTORY:  reports that he has been smoking Cigarettes.  He has been smoking about 1.00 pack per day. He does not have any smokeless tobacco history on file. He reports that he drinks alcohol. He reports that he uses illicit drugs (Marijuana, Heroin, Cocaine, and Methamphetamines).  REVIEW OF SYSTEMS:   Negative except above.  SUBJECTIVE:   VITAL SIGNS: Temp:  [97.8 F (36.6 C)-98.5 F (36.9 C)] 97.8 F (36.6 C) (04/03 0422) Pulse Rate:  [60-122] 60  (04/03 0422) Resp:  [18-24] 20 (04/03 0422) BP: (101-134)/(55-88) 101/55 mmHg (04/03 0422) SpO2:  [97 %-100 %] 100 % (04/03 0422) FiO2 (%):  [100 %] 100 % (04/02 2155) Weight:  [191 lb (86.637 kg)] 191 lb (86.637 kg) (04/02 2300)  PHYSICAL EXAMINATION: General: no distress Neuro: pleasant, follows commands, normal strength HEENT: Pupils reactive, no sinus tenderness, no LAN Cardiovascular:  Regular, no murmur Lungs:  Decreased BS Rt upper lung field, no wheeze/rales Abdomen: soft, non tender, normal bowel sounds Musculoskeletal: no edema Skin: no rashes  CBC Recent Labs     08/23/13  2000  08/24/13  0419  WBC  13.7*  8.1  HGB  15.2  13.7  HCT  42.8  40.6  PLT  241  194   BMET Recent Labs     08/23/13  2000  08/24/13  0419  NA  138  143  K  4.3  3.7  CL  101  108  CO2  26  26  BUN  13  14  CREATININE  1.08  1.00  GLUCOSE  94  109*    Electrolytes Recent Labs     08/23/13  2000  08/24/13  0419  CALCIUM  9.4  8.5   Liver Enzymes Recent Labs     08/23/13  2000  08/24/13  0419  AST  60*  49*  ALT  110*  92*  ALKPHOS  103  100  BILITOT  0.3  <0.2*  ALBUMIN  3.8  3.2*    Cardiac Enzymes Recent Labs     08/24/13  0023  TROPONINI  <0.30   Imaging Dg Chest 2 View  08/24/2013   CLINICAL DATA:  Evaluate pneumothorax  EXAM: CHEST  2 VIEW  COMPARISON:  DG CHEST 2 VIEW dated 08/23/2013; DG THORACIC SPINE dated 08/10/2008  FINDINGS: Grossly unchanged cardiac silhouette and mediastinal contours. The amount of air within the previously identified small to moderate-sized right-sided hydro pneumothorax is grossly unchanged though there is suspected minimal increase in the amount of fluid component. There is no significant deviation of the cardiomediastinal structures. Worsening right infrahilar heterogeneous/consolidative opacities. No evidence of edema. Unchanged bones.  IMPRESSION: 1. Minimal increase in the amount of fluid within an otherwise unchanged small to moderate  sized right-sided hydro pneumothorax. 2. Worsening of right middle lobe atelectasis.   Electronically Signed   By: Simonne Come M.D.   On: 08/24/2013 08:17   Dg Chest 2 View  08/23/2013   CLINICAL DATA:  Medical clearance  EXAM: CHEST  2 VIEW  COMPARISON:  08/10/2008  FINDINGS: Cardiomediastinal silhouette is stable. No acute infiltrate or pulmonary edema. There is about 20-25% right pneumothorax.  IMPRESSION: No acute infiltrate or pulmonary edema. There is about 20-25% right pneumothorax.  Critical Value/emergent results were called by telephone at the time of interpretation on 08/23/2013 at 8:05 PM to Dr. Wayland Salinas , who verbally acknowledged these results.   Electronically Signed   By: Natasha Mead M.D.   On: 08/23/2013 20:06    ASSESSMENT / PLAN:  A: Rt pleuritic chest pain from Rt pneumothorax in setting of tobacco/cocaine/THC abuse. P: Continue supplemental oxygen >> d/w pt indication for supplemental oxygen >> he is okay with nasal cannula, but could not tolerate mask over his face Defer chest tube placement for now F/u serial CXR's  A: Tobacco, polysubstance abuse. P: Per primary team  A: Hx of Hep C with elevated LFT's. P: Per primary team  Coralyn Helling, MD [facility] Pulmonary/Critical Care 08/24/2013, 12:44 PM Pager:  [phone] After 3pm call: [phone]

## 2013-08-24 NOTE — Consult Note (Signed)
Reason for Consult:cocain intoxication, cannabis abuse and Polysubstance dependence Referring Physician: Dr. Bennye Alm Martin Allen is an 33 y.o. male.  HPI: Patient was seen and chart reviewed. Patient denied current symptoms of substance withdrawal, depression, anxiety, psychosis and also denied suicide or homicidal ideation intentions or plans. Patient reported he already spoke with psychiatric social service who offered him residential substance abuse treatment available in local area. Patient stated he was also have some other programs because he has been in those places the past. Patient reported he does not want have any contact with his family members, he patient grandfather was at bedside during this evaluation. Patient reported he has been abusing drugs like crystal meth, cocaine, marijuana and IV opiates. Patient reported he has been using crystal meth and oh opiates sporadically and more frequently cocaine and marijuana.  Medical history: Martin Allen is a 33 y.o. male and history of polysubstance abuse and hepatitis C had come to the ER for drug detox. In addition patient complained of right-sided chest pain pleuritic in nature. Patient states that on Sunday 4 days ago when patient was having cocaine and methamphetamine started nothing acutely right-sided chest pain which was pleuritic in nature and initially was severe. Following which patient's chest pain slowly improved but still persisted. Denies any fever chills cough or any dizziness or loss of consciousness. Today patient had come for drug detox when he mentioned about the chest pain. Chest x-ray shows right-sided pneumothorax 20-30%. On-call pulmonary critical care Dr. Lake Bells was consulted and at this time they have requested admission and get repeat chest x-ray in a.m. and place patient on 100% nonrebreather. Patient otherwise denies any nausea vomiting abdominal pain focal deficits diarrhea.   Review of Systems: As presented in the  history of presenting illness, rest negative.  Mental Status Examination: Patient appeared as per his stated age, casually dressed, and fairly groomed, and maintaining good eye contact. Patient has irritable mood and his affect was appropriate. He has normal rate, rhythm, and volume of speech. His thought process is linear and goal directed. Patient has denied suicidal, homicidal ideations, intentions or plans. Patient has no evidence of auditory or visual hallucinations, delusions, and paranoia. Patient has fair insight judgment and impulse control.  Past Medical History  Diagnosis Date  . Hepatitis C virus   . Heroin addiction   . Mental disorder   . Depression     History reviewed. No pertinent past surgical history.  Family History  Problem Relation Age of Onset  . Hypertension Other     Social History:  reports that he has been smoking Cigarettes.  He has been smoking about 1.00 pack per day. He does not have any smokeless tobacco history on file. He reports that he drinks alcohol. He reports that he uses illicit drugs (Marijuana, Heroin, Cocaine, and Methamphetamines).  Allergies:  Allergies  Allergen Reactions  . Ceclor [Cefaclor] Other (See Comments)    unknown  . Penicillins Other (See Comments)    unknown    Medications: I have reviewed the patient's current medications.  Results for orders placed during the hospital encounter of 08/23/13 (from the past 48 hour(s))  ACETAMINOPHEN LEVEL     Status: None   Collection Time    08/23/13  8:00 PM      Result Value Ref Range   Acetaminophen (Tylenol), Serum <15.0  10 - 30 ug/mL   Comment:            THERAPEUTIC CONCENTRATIONS VARY  SIGNIFICANTLY. A RANGE OF 10-30     ug/mL MAY BE AN EFFECTIVE     CONCENTRATION FOR MANY PATIENTS.     HOWEVER, SOME ARE BEST TREATED     AT CONCENTRATIONS OUTSIDE THIS     RANGE.     ACETAMINOPHEN CONCENTRATIONS     >150 ug/mL AT 4 HOURS AFTER     INGESTION AND >50 ug/mL AT 12      HOURS AFTER INGESTION ARE     OFTEN ASSOCIATED WITH TOXIC     REACTIONS.  CBC     Status: Abnormal   Collection Time    08/23/13  8:00 PM      Result Value Ref Range   WBC 13.7 (*) 4.0 - 10.5 K/uL   RBC 4.75  4.22 - 5.81 MIL/uL   Hemoglobin 15.2  13.0 - 17.0 g/dL   HCT 42.8  39.0 - 52.0 %   MCV 90.1  78.0 - 100.0 fL   MCH 32.0  26.0 - 34.0 pg   MCHC 35.5  30.0 - 36.0 g/dL   RDW 12.9  11.5 - 15.5 %   Platelets 241  150 - 400 K/uL  COMPREHENSIVE METABOLIC PANEL     Status: Abnormal   Collection Time    08/23/13  8:00 PM      Result Value Ref Range   Sodium 138  137 - 147 mEq/L   Potassium 4.3  3.7 - 5.3 mEq/L   Chloride 101  96 - 112 mEq/L   CO2 26  19 - 32 mEq/L   Glucose, Bld 94  70 - 99 mg/dL   BUN 13  6 - 23 mg/dL   Creatinine, Ser 1.08  0.50 - 1.35 mg/dL   Calcium 9.4  8.4 - 10.5 mg/dL   Total Protein 7.5  6.0 - 8.3 g/dL   Albumin 3.8  3.5 - 5.2 g/dL   AST 60 (*) 0 - 37 U/L   ALT 110 (*) 0 - 53 U/L   Alkaline Phosphatase 103  39 - 117 U/L   Total Bilirubin 0.3  0.3 - 1.2 mg/dL   GFR calc non Af Amer >90  >90 mL/min   GFR calc Af Amer >90  >90 mL/min   Comment: (NOTE)     The eGFR has been calculated using the CKD EPI equation.     This calculation has not been validated in all clinical situations.     eGFR's persistently <90 mL/min signify possible Chronic Kidney     Disease.  ETHANOL     Status: None   Collection Time    08/23/13  8:00 PM      Result Value Ref Range   Alcohol, Ethyl (B) <11  0 - 11 mg/dL   Comment:            LOWEST DETECTABLE LIMIT FOR     SERUM ALCOHOL IS 11 mg/dL     FOR MEDICAL PURPOSES ONLY  SALICYLATE LEVEL     Status: Abnormal   Collection Time    08/23/13  8:00 PM      Result Value Ref Range   Salicylate Lvl <8.9 (*) 2.8 - 20.0 mg/dL  D-DIMER, QUANTITATIVE     Status: None   Collection Time    08/23/13  8:00 PM      Result Value Ref Range   D-Dimer, Quant 0.27  0.00 - 0.48 ug/mL-FEU   Comment:            AT THE  INHOUSE  ESTABLISHED CUTOFF     VALUE OF 0.48 ug/mL FEU,     THIS ASSAY HAS BEEN DOCUMENTED     IN THE LITERATURE TO HAVE     A SENSITIVITY AND NEGATIVE     PREDICTIVE VALUE OF AT LEAST     98 TO 99%.  THE TEST RESULT     SHOULD BE CORRELATED WITH     AN ASSESSMENT OF THE CLINICAL     PROBABILITY OF DVT / VTE.  Randolm Idol, ED     Status: None   Collection Time    08/23/13  8:06 PM      Result Value Ref Range   Troponin i, poc 0.00  0.00 - 0.08 ng/mL   Comment 3            Comment: Due to the release kinetics of cTnI,     a negative result within the first hours     of the onset of symptoms does not rule out     myocardial infarction with certainty.     If myocardial infarction is still suspected,     repeat the test at appropriate intervals.  URINE RAPID DRUG SCREEN (HOSP PERFORMED)     Status: Abnormal   Collection Time    08/23/13  8:27 PM      Result Value Ref Range   Opiates NONE DETECTED  NONE DETECTED   Cocaine POSITIVE (*) NONE DETECTED   Benzodiazepines NONE DETECTED  NONE DETECTED   Amphetamines NONE DETECTED  NONE DETECTED   Tetrahydrocannabinol POSITIVE (*) NONE DETECTED   Barbiturates NONE DETECTED  NONE DETECTED   Comment:            DRUG SCREEN FOR MEDICAL PURPOSES     ONLY.  IF CONFIRMATION IS NEEDED     FOR ANY PURPOSE, NOTIFY LAB     WITHIN 5 DAYS.                LOWEST DETECTABLE LIMITS     FOR URINE DRUG SCREEN     Drug Class       Cutoff (ng/mL)     Amphetamine      1000     Barbiturate      200     Benzodiazepine   659     Tricyclics       935     Opiates          300     Cocaine          300     THC              50  CK     Status: None   Collection Time    08/24/13 12:23 AM      Result Value Ref Range   Total CK 154  7 - 232 U/L  TROPONIN I     Status: None   Collection Time    08/24/13 12:23 AM      Result Value Ref Range   Troponin I <0.30  <0.30 ng/mL   Comment:            Due to the release kinetics of cTnI,     a negative result  within the first hours     of the onset of symptoms does not rule out     myocardial infarction with certainty.     If myocardial infarction is still suspected,     repeat the test  at appropriate intervals.  COMPREHENSIVE METABOLIC PANEL     Status: Abnormal   Collection Time    08/24/13  4:19 AM      Result Value Ref Range   Sodium 143  137 - 147 mEq/L   Potassium 3.7  3.7 - 5.3 mEq/L   Chloride 108  96 - 112 mEq/L   CO2 26  19 - 32 mEq/L   Glucose, Bld 109 (*) 70 - 99 mg/dL   BUN 14  6 - 23 mg/dL   Creatinine, Ser 1.00  0.50 - 1.35 mg/dL   Calcium 8.5  8.4 - 10.5 mg/dL   Total Protein 6.2  6.0 - 8.3 g/dL   Albumin 3.2 (*) 3.5 - 5.2 g/dL   AST 49 (*) 0 - 37 U/L   ALT 92 (*) 0 - 53 U/L   Alkaline Phosphatase 100  39 - 117 U/L   Total Bilirubin <0.2 (*) 0.3 - 1.2 mg/dL   GFR calc non Af Amer >90  >90 mL/min   GFR calc Af Amer >90  >90 mL/min   Comment: (NOTE)     The eGFR has been calculated using the CKD EPI equation.     This calculation has not been validated in all clinical situations.     eGFR's persistently <90 mL/min signify possible Chronic Kidney     Disease.  CBC     Status: None   Collection Time    08/24/13  4:19 AM      Result Value Ref Range   WBC 8.1  4.0 - 10.5 K/uL   RBC 4.48  4.22 - 5.81 MIL/uL   Hemoglobin 13.7  13.0 - 17.0 g/dL   HCT 40.6  39.0 - 52.0 %   MCV 90.6  78.0 - 100.0 fL   MCH 30.6  26.0 - 34.0 pg   MCHC 33.7  30.0 - 36.0 g/dL   RDW 13.1  11.5 - 15.5 %   Platelets 194  150 - 400 K/uL  URINALYSIS, ROUTINE W REFLEX MICROSCOPIC     Status: Abnormal   Collection Time    08/24/13  4:43 PM      Result Value Ref Range   Color, Urine YELLOW  YELLOW   APPearance TURBID (*) CLEAR   Specific Gravity, Urine 1.020  1.005 - 1.030   pH 8.0  5.0 - 8.0   Glucose, UA NEGATIVE  NEGATIVE mg/dL   Hgb urine dipstick NEGATIVE  NEGATIVE   Bilirubin Urine NEGATIVE  NEGATIVE   Ketones, ur NEGATIVE  NEGATIVE mg/dL   Protein, ur NEGATIVE  NEGATIVE mg/dL    Urobilinogen, UA 1.0  0.0 - 1.0 mg/dL   Nitrite NEGATIVE  NEGATIVE   Leukocytes, UA NEGATIVE  NEGATIVE  URINE MICROSCOPIC-ADD ON     Status: Abnormal   Collection Time    08/24/13  4:43 PM      Result Value Ref Range   Squamous Epithelial / LPF RARE  RARE   Bacteria, UA FEW (*) RARE   Urine-Other AMORPHOUS URATES/PHOSPHATES      Dg Chest 2 View  08/24/2013   CLINICAL DATA:  Evaluate pneumothorax  EXAM: CHEST  2 VIEW  COMPARISON:  DG CHEST 2 VIEW dated 08/23/2013; DG THORACIC SPINE dated 08/10/2008  FINDINGS: Grossly unchanged cardiac silhouette and mediastinal contours. The amount of air within the previously identified small to moderate-sized right-sided hydro pneumothorax is grossly unchanged though there is suspected minimal increase in the amount of fluid component. There is no significant  deviation of the cardiomediastinal structures. Worsening right infrahilar heterogeneous/consolidative opacities. No evidence of edema. Unchanged bones.  IMPRESSION: 1. Minimal increase in the amount of fluid within an otherwise unchanged small to moderate sized right-sided hydro pneumothorax. 2. Worsening of right middle lobe atelectasis.   Electronically Signed   By: Sandi Mariscal M.D.   On: 08/24/2013 08:17   Dg Chest 2 View  08/23/2013   CLINICAL DATA:  Medical clearance  EXAM: CHEST  2 VIEW  COMPARISON:  08/10/2008  FINDINGS: Cardiomediastinal silhouette is stable. No acute infiltrate or pulmonary edema. There is about 20-25% right pneumothorax.  IMPRESSION: No acute infiltrate or pulmonary edema. There is about 20-25% right pneumothorax.  Critical Value/emergent results were called by telephone at the time of interpretation on 08/23/2013 at 8:05 PM to Dr. Riki Altes , who verbally acknowledged these results.   Electronically Signed   By: Lahoma Crocker M.D.   On: 08/23/2013 20:06   Dg Chest Port 1 View  08/24/2013   CLINICAL DATA:  Pneumothorax  EXAM: PORTABLE CHEST - 1 VIEW  COMPARISON:  Study obtained earlier in  the day  FINDINGS: The right-sided pneumothorax is essentially stable compared to earlier in the day. There is no appreciable tension component. Elsewhere, lungs are clear. Heart size and pulmonary vascularity are normal. No adenopathy. No appreciable bone lesions.  IMPRESSION: Stable pneumothorax on the right without appreciable tension component. Lungs elsewhere clear.   Electronically Signed   By: Lowella Grip M.D.   On: 08/24/2013 15:44    Positive for illegal drug usage Blood pressure 118/55, pulse 60, temperature 97.5 F (36.4 C), temperature source Oral, resp. rate 20, height $RemoveBe'6\' 1"'[id number]$  (1.854 m), weight 86.637 kg (191 lb), SpO2 99.00%.   Assessment/Plan: Polysubstance dependence  Recommendation: Patient does not meet criteria for acute psychiatric hospitalization as he has no safety concerns Recommended residential substance abuse treatment program when medically cleared Recommendations no psychotropic medication Appreciate psychiatric consultation and will sign off at this time  Nazeer Romney,JANARDHAHA R. 08/24/2013, 6:32 PM

## 2013-08-24 NOTE — Progress Notes (Signed)
Nutrition Brief Note  Patient identified on the Malnutrition Screening Tool (MST) Report  Wt Readings from Last 15 Encounters:  08/23/13 191 lb (86.637 kg)  06/16/12 180 lb (81.647 kg)  05/12/12 180 lb (81.647 kg)  04/01/12 185 lb (83.915 kg)    Body mass index is 25.2 kg/(m^2). Patient meets criteria for overweight based on current BMI.   Admitted for drug detox. Hx of hepatitis C, depression, and heroin addiction. Current diet order is regular, patient is consuming approximately 100% of meals at this time. Labs and medications reviewed. AST/ALT elevated. Met with pt who reports eating well PTA with stable weight. States he is eating 100% of meals. Requests Boost for additional nutrition, will order.   No nutrition interventions warranted at this time. If nutrition issues arise, please consult RD.   Mikey College MS, [place], [place] Pager [phone] After Hours Pager

## 2013-08-24 NOTE — Progress Notes (Signed)
[facility] PROGRESS NOTE  Martin Allen WUJ:[id number] DOB: 07-Mar-1981 DOA: 08/23/2013 PCP: No primary provider on file. Brief HPI: Martin Allen is a 33 y.o. male and history of polysubstance abuse and hepatitis C had come to the ER for drug detox. In addition patient complained of right-sided chest pain pleuritic in nature. Patient states that on Sunday 4 days ago when patient was having cocaine and methamphetamine started nothing acutely right-sided chest pain which was pleuritic in nature and initially was severe. Following which patient's chest pain slowly improved but still persisted. Denies any fever chills cough or any dizziness or loss of consciousness. Today patient had come for drug detox when he mentioned about the chest pain. Chest x-ray shows right-sided pneumothorax 20-30%. On-call pulmonary critical care Dr. Kendrick Fries was consulted and at this time they have requested admission and get repeat chest x-ray in a.m. and place patient on 100% nonrebreather. Patient otherwise denies any nausea vomiting abdominal pain focal deficits diarrhea. Repeat CXR done this am showed slight worsening of the moderate hydropneumo thorax. Pulmonologist consulted in am for further recommendations.     Assessment/Plan: 1. Spontaneous pneumo and hydro thorax: - currently appears comfortable. On 100% oxygen . Pulmonologist consulted this am. He might need a chest tube .  2. Polysubstance abuse: came in for de tox. UDS positive for cocaine and tetrahydrocannabinol. Requested psychaitry consult .  Will get social worker involved to get the resources.   3. Elevated liver function tests: probably secondary to hepatitis c . He will need referral to gastroenterology as outpatient for starting hep c treatment.   4. Leukocytosis: probably reactive. He is afebrile. And his repeat CBC shows resolution of leukocytosis.   5. Chest pain: probably from #1. troponins negative.   DVT prophylaxis.   Code Status:  fullc ode Family Communication: none at bedside.  Disposition Plan: pending further evaluation.    Consultants:  PULMONOLOGY Dr Maple Hudson.   Procedures:  CXR  Antibiotics:  none  HPI/Subjective: REPORTS occasional right sided chest pain on deep breathing. Off 100%  Oxygen.  Objective: Filed Vitals:   08/24/13 0422  BP: 101/55  Pulse: 60  Temp: 97.8 F (36.6 C)  Resp: 20   No intake or output data in the 24 hours ending 08/24/13 0853 Filed Weights   08/23/13 2300  Weight: 86.637 kg (191 lb)    Exam:   General:  Alert afebrile comfortable  Cardiovascular: s1s2. No m/R/G.   Respiratory: ctab, no wheezing heard  Abdomen: soft NT NDBS+  Musculoskeletal:  No pedal edema.   Data Reviewed: Basic Metabolic Panel:  Recent Labs Lab 08/23/13 2000 08/24/13 0419  NA 138 143  K 4.3 3.7  CL 101 108  CO2 26 26  GLUCOSE 94 109*  BUN 13 14  CREATININE 1.08 1.00  CALCIUM 9.4 8.5   Liver Function Tests:  Recent Labs Lab 08/23/13 2000 08/24/13 0419  AST 60* 49*  ALT 110* 92*  ALKPHOS 103 100  BILITOT 0.3 <0.2*  PROT 7.5 6.2  ALBUMIN 3.8 3.2*   No results found for this basename: LIPASE, AMYLASE,  in the last 168 hours No results found for this basename: AMMONIA,  in the last 168 hours CBC:  Recent Labs Lab 08/23/13 2000 08/24/13 0419  WBC 13.7* 8.1  HGB 15.2 13.7  HCT 42.8 40.6  MCV 90.1 90.6  PLT 241 194   Cardiac Enzymes:  Recent Labs Lab 08/24/13 0023  CKTOTAL 154  TROPONINI <0.30   BNP (last 3  results) No results found for this basename: PROBNP,  in the last 8760 hours CBG: No results found for this basename: GLUCAP,  in the last 168 hours  No results found for this or any previous visit (from the past 240 hour(s)).   Studies: Dg Chest 2 View  08/24/2013   CLINICAL DATA:  Evaluate pneumothorax  EXAM: CHEST  2 VIEW  COMPARISON:  DG CHEST 2 VIEW dated 08/23/2013; DG THORACIC SPINE dated 08/10/2008  FINDINGS: Grossly unchanged cardiac  silhouette and mediastinal contours. The amount of air within the previously identified small to moderate-sized right-sided hydro pneumothorax is grossly unchanged though there is suspected minimal increase in the amount of fluid component. There is no significant deviation of the cardiomediastinal structures. Worsening right infrahilar heterogeneous/consolidative opacities. No evidence of edema. Unchanged bones.  IMPRESSION: 1. Minimal increase in the amount of fluid within an otherwise unchanged small to moderate sized right-sided hydro pneumothorax. 2. Worsening of right middle lobe atelectasis.   Electronically Signed   By: Simonne ComeJohn  Watts M.D.   On: 08/24/2013 08:17   Dg Chest 2 View  08/23/2013   CLINICAL DATA:  Medical clearance  EXAM: CHEST  2 VIEW  COMPARISON:  08/10/2008  FINDINGS: Cardiomediastinal silhouette is stable. No acute infiltrate or pulmonary edema. There is about 20-25% right pneumothorax.  IMPRESSION: No acute infiltrate or pulmonary edema. There is about 20-25% right pneumothorax.  Critical Value/emergent results were called by telephone at the time of interpretation on 08/23/2013 at 8:05 PM to Dr. Wayland SalinasJOHN BEDNAR , who verbally acknowledged these results.   Electronically Signed   By: Natasha MeadLiviu  Pop M.D.   On: 08/23/2013 20:06    Scheduled Meds: . cloNIDine  0.1 mg Oral QID   Followed by  . [START ON 08/26/2013] cloNIDine  0.1 mg Oral BH-qamhs   Followed by  . [START ON 08/29/2013] cloNIDine  0.1 mg Oral QAC breakfast  . sodium chloride  3 mL Intravenous Q12H   Continuous Infusions: . sodium chloride      Principal Problem:   Pneumothorax Active Problems:   Polysubstance abuse   Hepatitis C   Leucocytosis    Time spent: 25 minutes.     [facility] LLCKULA,Rykker Coviello  [facility] Pager [phone] If 7PM-7AM, please contact night-coverage at [email or web address], password [email or web address] 08/24/2013, 8:53 AM  LOS: 1 day

## 2013-08-24 NOTE — Progress Notes (Signed)
Utilization review completed.  

## 2013-08-25 ENCOUNTER — Inpatient Hospital Stay (HOSPITAL_COMMUNITY): Payer: Self-pay

## 2013-08-25 NOTE — Progress Notes (Signed)
I saw pt dressed and getting on the elevator, I was able to stop him and ask what he was doing.  He stated he was leaving.  He had refused 10am meds, and refused IV restart.  He stated he was just not staying here.  I explained that his condition was not fixed and that he would probably  return and risked getting chest tube or even chance of death if not treated.  He stated that was fine and agreed to sign AMA paper.  Dr Sherene SiresWert was notified.

## 2013-08-25 NOTE — Progress Notes (Signed)
 Name: Martin Allen MRN: [id number] DOB: 08/13/1980    ADMISSION DATE:  08/23/2013 CONSULTATION DATE:  08/24/2013  REFERRING MD :  Vijaya Akula  CHIEF COMPLAINT:  Chest pain  BRIEF PATIENT DESCRIPTION:  33 yo smoker with hx of polysubstance abuse presented to ER for detox and Rt sided pleuritic chest pain present for 4 days prior to admission.  Found to have Rt sided PTX and PCCM consulted.  SIGNIFICANT EVENTS: 4/02 Admit 4/03 PCCM consult  STUDIES:  4/02 UDS >> cocaine, THC  LINES / TUBES: PIV   SUBJECTIVE:  Not wearing 02 "it fell off" Mild cp with deep breath, no sob    VITAL SIGNS: Temp:  [97.5 F (36.4 C)-97.9 F (36.6 C)] 97.9 F (36.6 C) (04/04 0500) Pulse Rate:  [60-63] 60 (04/04 0500) Resp:  [18-20] 18 (04/04 0500) BP: (101-121)/(50-62) 101/50 mmHg (04/04 0500) SpO2:  [98 %-100 %] 98 % (04/04 0500)  PHYSICAL EXAMINATION: General: no distress Neuro: pleasant,no deficits HEENT: orophx clear Cardiovascular:  Regular, no murmur Lungs:  Decreased BS Rt upper lung field, no wheeze/rales Abdomen: soft, non tender, normal bowel sounds Musculoskeletal: no edema Skin: no rashes  CBC Recent Labs     08/23/13  2000  08/24/13  0419  WBC  13.7*  8.1  HGB  15.2  13.7  HCT  42.8  40.6  PLT  241  194   BMET Recent Labs     08/23/13  2000  08/24/13  0419  NA  138  143  K  4.3  3.7  CL  101  108  CO2  26  26  BUN  13  14  CREATININE  1.08  1.00  GLUCOSE  94  109*    Electrolytes Recent Labs     08/23/13  2000  08/24/13  0419  CALCIUM  9.4  8.5   Liver Enzymes Recent Labs     08/23/13  2000  08/24/13  0419  AST  60*  49*  ALT  110*  92*  ALKPHOS  103  100  BILITOT  0.3  <0.2*  ALBUMIN  3.8  3.2*    Cardiac Enzymes Recent Labs     08/24/13  0023  TROPONINI  <0.30   Imaging Dg Chest 2 View  08/24/2013   CLINICAL DATA:  Evaluate pneumothorax  EXAM: CHEST  2 VIEW  COMPARISON:  DG CHEST 2 VIEW dated 08/23/2013; DG THORACIC SPINE dated  08/10/2008  FINDINGS: Grossly unchanged cardiac silhouette and mediastinal contours. The amount of air within the previously identified small to moderate-sized right-sided hydro pneumothorax is grossly unchanged though there is suspected minimal increase in the amount of fluid component. There is no significant deviation of the cardiomediastinal structures. Worsening right infrahilar heterogeneous/consolidative opacities. No evidence of edema. Unchanged bones.  IMPRESSION: 1. Minimal increase in the amount of fluid within an otherwise unchanged small to moderate sized right-sided hydro pneumothorax. 2. Worsening of right middle lobe atelectasis.   Electronically Signed   By: Martin  Allen M.D.   On: 08/24/2013 08:17   Dg Chest 2 View  08/23/2013   CLINICAL DATA:  Medical clearance  EXAM: CHEST  2 VIEW  COMPARISON:  08/10/2008  FINDINGS: Cardiomediastinal silhouette is stable. No acute infiltrate or pulmonary edema. There is about 20-25% right pneumothorax.  IMPRESSION: No acute infiltrate or pulmonary edema. There is about 20-25% right pneumothorax.  Critical Value/emergent results were called by telephone at the time of interpretation on 08/23/2013 at 8:05 PM to   Dr. JOHN Allen , who verbally acknowledged these results.   Electronically Signed   By: Martin  Allen M.D.   On: 08/23/2013 20:06   Dg Chest Port 1 View  08/25/2013   CLINICAL DATA:  Follow up pneumothorax.  EXAM: PORTABLE CHEST - 1 VIEW  COMPARISON:  DG CHEST 1V PORT dated 08/24/2013; DG CHEST 2 VIEW dated 08/24/2013  FINDINGS: 0512 hr. The pleural edge for the basilar component of the right-sided pneumothorax is more conspicuous, and the pneumothorax may be slightly larger. The apical component appears unchanged. There is no mediastinal shift. Mild right basilar atelectasis is noted. The left lung is clear. The heart size and mediastinal contours are stable. No definite fractures are seen.  IMPRESSION: Possible minimal enlargement of right-sided pneumothorax  without tension component.   Electronically Signed   By: Martin  Allen M.D.   On: 08/25/2013 07:22   Dg Chest Port 1 View  08/24/2013   CLINICAL DATA:  Pneumothorax  EXAM: PORTABLE CHEST - 1 VIEW  COMPARISON:  Study obtained earlier in the day  FINDINGS: The right-sided pneumothorax is essentially stable compared to earlier in the day. There is no appreciable tension component. Elsewhere, lungs are clear. Heart size and pulmonary vascularity are normal. No adenopathy. No appreciable bone lesions.  IMPRESSION: Stable pneumothorax on the right without appreciable tension component. Lungs elsewhere clear.   Electronically Signed   By: Martin  Allen M.D.   On: 08/24/2013 15:44    ASSESSMENT / PLAN:  A: Rt pleuritic chest pain from Rt pneumothorax in setting of tobacco/cocaine/THC abuse. P: Continue supplemental oxygen >> d/w pt indication for supplemental oxygen >> he is okay with nasal cannula, but could not tolerate mask over his face Defer chest tube placement for now F/u serial CXR's Keep 02 At 4lpm with humidity to make it more tolerable - explained again the goal of the 02 is help to re-absorb the ptx, not "comfort"  A: Tobacco, polysubstance abuse. P: Per primary team  A: Hx of Hep C with elevated LFT's. P: Per primary team     [name], MD Pulmonary and Critical Care Medicine  Healthcare Cell [phone] After 5:30 PM or weekends, call [phone]     

## 2013-08-25 NOTE — Progress Notes (Signed)
Chart review.  Noted that Pt d/c'd AMA.  [facility] notified.  Providence CrosbyAmanda Roselle Norton, LCSWA Clinical Social Work [phone]

## 2013-08-25 NOTE — Consult Note (Deleted)
Name: Martin Allen MRN: [id number] DOB: 1980/12/26    ADMISSION DATE:  08/23/2013 CONSULTATION DATE:  08/24/2013  REFERRING MD :  Kathlen ModyVijaya Akula  CHIEF COMPLAINT:  Chest pain  BRIEF PATIENT DESCRIPTION:  33 yo smoker with hx of polysubstance abuse presented to ER for detox and Rt sided pleuritic chest pain present for 4 days prior to admission.  Found to have Rt sided PTX and PCCM consulted.  SIGNIFICANT EVENTS: 4/02 Admit 4/03 PCCM consult  STUDIES:  4/02 UDS >> cocaine, THC  LINES / TUBES: PIV   SUBJECTIVE:  Not wearing 02 "it fell off" Mild cp with deep breath, no sob    VITAL SIGNS: Temp:  [97.5 F (36.4 C)-97.9 F (36.6 C)] 97.9 F (36.6 C) (04/04 0500) Pulse Rate:  [60-63] 60 (04/04 0500) Resp:  [18-20] 18 (04/04 0500) BP: (101-121)/(50-62) 101/50 mmHg (04/04 0500) SpO2:  [98 %-100 %] 98 % (04/04 0500)  PHYSICAL EXAMINATION: General: no distress Neuro: pleasant,no deficits HEENT: orophx clear Cardiovascular:  Regular, no murmur Lungs:  Decreased BS Rt upper lung field, no wheeze/rales Abdomen: soft, non tender, normal bowel sounds Musculoskeletal: no edema Skin: no rashes  CBC Recent Labs     08/23/13  2000  08/24/13  0419  WBC  13.7*  8.1  HGB  15.2  13.7  HCT  42.8  40.6  PLT  241  194   BMET Recent Labs     08/23/13  2000  08/24/13  0419  NA  138  143  K  4.3  3.7  CL  101  108  CO2  26  26  BUN  13  14  CREATININE  1.08  1.00  GLUCOSE  94  109*    Electrolytes Recent Labs     08/23/13  2000  08/24/13  0419  CALCIUM  9.4  8.5   Liver Enzymes Recent Labs     08/23/13  2000  08/24/13  0419  AST  60*  49*  ALT  110*  92*  ALKPHOS  103  100  BILITOT  0.3  <0.2*  ALBUMIN  3.8  3.2*    Cardiac Enzymes Recent Labs     08/24/13  0023  TROPONINI  <0.30   Imaging Dg Chest 2 View  08/24/2013   CLINICAL DATA:  Evaluate pneumothorax  EXAM: CHEST  2 VIEW  COMPARISON:  DG CHEST 2 VIEW dated 08/23/2013; DG THORACIC SPINE dated  08/10/2008  FINDINGS: Grossly unchanged cardiac silhouette and mediastinal contours. The amount of air within the previously identified small to moderate-sized right-sided hydro pneumothorax is grossly unchanged though there is suspected minimal increase in the amount of fluid component. There is no significant deviation of the cardiomediastinal structures. Worsening right infrahilar heterogeneous/consolidative opacities. No evidence of edema. Unchanged bones.  IMPRESSION: 1. Minimal increase in the amount of fluid within an otherwise unchanged small to moderate sized right-sided hydro pneumothorax. 2. Worsening of right middle lobe atelectasis.   Electronically Signed   By: Simonne ComeJohn  Watts M.D.   On: 08/24/2013 08:17   Dg Chest 2 View  08/23/2013   CLINICAL DATA:  Medical clearance  EXAM: CHEST  2 VIEW  COMPARISON:  08/10/2008  FINDINGS: Cardiomediastinal silhouette is stable. No acute infiltrate or pulmonary edema. There is about 20-25% right pneumothorax.  IMPRESSION: No acute infiltrate or pulmonary edema. There is about 20-25% right pneumothorax.  Critical Value/emergent results were called by telephone at the time of interpretation on 08/23/2013 at 8:05 PM to  Dr. Wayland Salinas , who verbally acknowledged these results.   Electronically Signed   By: Natasha Mead M.D.   On: 08/23/2013 20:06   Dg Chest Port 1 View  08/25/2013   CLINICAL DATA:  Follow up pneumothorax.  EXAM: PORTABLE CHEST - 1 VIEW  COMPARISON:  DG CHEST 1V PORT dated 08/24/2013; DG CHEST 2 VIEW dated 08/24/2013  FINDINGS: 0512 hr. The pleural edge for the basilar component of the right-sided pneumothorax is more conspicuous, and the pneumothorax may be slightly larger. The apical component appears unchanged. There is no mediastinal shift. Mild right basilar atelectasis is noted. The left lung is clear. The heart size and mediastinal contours are stable. No definite fractures are seen.  IMPRESSION: Possible minimal enlargement of right-sided pneumothorax  without tension component.   Electronically Signed   By: Roxy Horseman M.D.   On: 08/25/2013 07:22   Dg Chest Port 1 View  08/24/2013   CLINICAL DATA:  Pneumothorax  EXAM: PORTABLE CHEST - 1 VIEW  COMPARISON:  Study obtained earlier in the day  FINDINGS: The right-sided pneumothorax is essentially stable compared to earlier in the day. There is no appreciable tension component. Elsewhere, lungs are clear. Heart size and pulmonary vascularity are normal. No adenopathy. No appreciable bone lesions.  IMPRESSION: Stable pneumothorax on the right without appreciable tension component. Lungs elsewhere clear.   Electronically Signed   By: Bretta Bang M.D.   On: 08/24/2013 15:44    ASSESSMENT / PLAN:  A: Rt pleuritic chest pain from Rt pneumothorax in setting of tobacco/cocaine/THC abuse. P: Continue supplemental oxygen >> d/w pt indication for supplemental oxygen >> he is okay with nasal cannula, but could not tolerate mask over his face Defer chest tube placement for now F/u serial CXR's Keep 02 At 4lpm with humidity to make it more tolerable - explained again the goal of the 02 is help to re-absorb the ptx, not "comfort"  A: Tobacco, polysubstance abuse. P: Per primary team  A: Hx of Hep C with elevated LFT's. P: Per primary team     Sandrea Hughs, MD Pulmonary and Critical Care Medicine [facility] Cell [phone] After 5:30 PM or weekends, call [phone]

## 2013-09-02 NOTE — Discharge Summary (Signed)
Pt left AMA.    HPI:  Martin Allen is a 33 y.o. male and history of polysubstance abuse and hepatitis C had come to the ER for drug detox. In addition patient complained of right-sided chest pain pleuritic in nature. Patient states that on Sunday 4 days ago when patient was having cocaine and methamphetamine started nothing acutely right-sided chest pain which was pleuritic in nature and initially was severe. Following which patient's chest pain slowly improved but still persisted. Denies any fever chills cough or any dizziness or loss of consciousness. Today patient had come for drug detox when he mentioned about the chest pain. Chest x-ray shows right-sided pneumothorax 20-30%. On-call pulmonary critical care Dr. Kendrick FriesMcQuaid was consulted and at this time they have requested admission and get repeat chest x-ray in a.m. and place patient on 100% nonrebreather. Patient otherwise denies any nausea vomiting abdominal pain focal deficits diarrhea. Repeat CXR done this am showed slight worsening of the moderate hydropneumo thorax. Pulmonologist consulted in am for further recommendations.     1. Spontaneous pneumo and hydro thorax: - currently appears comfortable. On 100% oxygen . Pulmonologist consulted , recommended supplemental oxygen and follow up with serial cxr's. 2. Polysubstance abuse: came in for de tox. UDS positive for cocaine and tetrahydrocannabinol. Requested psychaitry consult . Will get social worker involved to get the resources.  3. Elevated liver function tests: probably secondary to hepatitis c . He will need referral to gastroenterology as outpatient for starting hep c treatment.  4. Leukocytosis: probably reactive. He is afebrile. And his repeat CBC shows resolution of leukocytosis.  5. Chest pain: probably from #1. troponins negative.

## 2013-10-23 ENCOUNTER — Ambulatory Visit (HOSPITAL_COMMUNITY)
Admission: RE | Admit: 2013-10-23 | Discharge: 2013-10-23 | Disposition: A | Discharge status: Home / Self Care | Payer: Self-pay | Attending: Psychiatry | Admitting: Psychiatry

## 2013-10-23 NOTE — BH Assessment (Signed)
Tele Assessment Note   Martin Allen is an 33 y.o. male who presents as unaccompanied walk-in to [facility].  Upon arrival pt endorses SI with plan to overdose on meds. Pt presented irritated and upset during assessment. Pt answered questions very briefly and abruptly. Pt had poor eye contact. Pt Ox4. Pt coherent and relevant thought processes. Pt also reported use of crystal meth and marijuana. Pt denied experiencing withdrawal symptoms at this time. Pt reported depressed mood but presented with irritable and agitated affect. Pt denied HI and AVH at this time. Pt reported several incidents of detox inpatient but none due to mental health concerns.   Upon discussion of process to send pt to [facility] for medical clearance pt became more agitated and stated "No forget it, I'm not suicidal. I was just looking for a bed." Pt recanted SI with plan and stated he was able to contract for safety. Pt was informed there was indeed an available bed at [facility] and [facility] would provide transportation to and from [facility] for medical clearance. Pt refused. Pt signed MSE and contracted for safety.  Axis I: Unspecified Depressive Disorder Axis II: Deferred Axis III:  Past Medical History  Diagnosis Date  . Hepatitis C virus   . Heroin addiction   . Mental disorder   . Depression    Axis IV: economic problems, housing problems, occupational problems and problems with primary support group  Past Medical History:  Past Medical History  Diagnosis Date  . Hepatitis C virus   . Heroin addiction   . Mental disorder   . Depression     No past surgical history on file.  Family History:  Family History  Problem Relation Age of Onset  . Hypertension Other     Social History:  reports that he has been smoking Cigarettes.  He has been smoking about 1.00 pack per day. He does not have any smokeless tobacco history on file. He reports that he drinks alcohol. He reports that he uses illicit drugs (Marijuana, Heroin, Cocaine,  and Methamphetamines).  Additional Social History:  Alcohol / Drug Use Pain Medications: none reported Prescriptions: none reported Over the Counter: none reported History of alcohol / drug use?: Yes Longest period of sobriety (when/how long): unk Substance #1 Name of Substance 1: crystal meth 1 - Age of First Use: unk 1 - Amount (size/oz): $50/day 1 - Frequency: daily 1 - Duration: ongoing 1 - Last Use / Amount: 10/23/13 Substance #2 Name of Substance 2: marijuana 2 - Age of First Use: unk 2 - Amount (size/oz): "a couple of blunts" 2 - Frequency: daily 2 - Duration: ongoing 2 - Last Use / Amount: 10/23/13  CIWA:   COWS:    Allergies:  Allergies  Allergen Reactions  . Ceclor [Cefaclor] Other (See Comments)    unknown  . Penicillins Other (See Comments)    unknown    Home Medications:  (Not in a hospital admission)  OB/GYN Status:  No LMP for male patient.  General Assessment Data [facility] of Assessment: [facility] Is this a Tele or Face-to-Face Assessment?: Face-to-Face Is this an Initial Assessment or a Re-assessment for this encounter?: Initial Assessment Living Arrangements: Other (Comment) (Pt reported being homeless.) Can pt return to current living arrangement?: No Admission Status: Voluntary Is patient capable of signing voluntary admission?: Yes Transfer from: Home Referral Source: Self/Family/Friend  Medical Screening Exam [facility] Walk-in ONLY) Medical Exam completed: No Reason for MSE not completed: Patient Refused  [facility] Crisis Care  Plan Living Arrangements: Other (Comment) (Pt reported being homeless.) Name of Psychiatrist:  (none reported) Name of Therapist:  (none reported)     Risk to self Suicidal Ideation: Yes-Currently Present Suicidal Intent: Yes-Currently Present Is patient at risk for suicide?: Yes Suicidal Plan?: Yes-Currently Present Specify Current Suicidal Plan:  (Pt reported plan to overdose.) Access to Means:   (unk) What has been your use of drugs/alcohol within the last 12 months?:  (crystal meth, marijuana) Previous Attempts/Gestures: No How many times?: 0 Other Self Harm Risks:  (none reported) Triggers for Past Attempts: None known Intentional Self Injurious Behavior: None Family Suicide History: Unknown Recent stressful life event(s): Financial Problems;Other (Comment) (Drug use) Persecutory voices/beliefs?: No Depression: Yes Depression Symptoms: Despondent;Feeling angry/irritable Substance abuse history and/or treatment for substance abuse?: Yes Suicide prevention information given to non-admitted patients: Yes  Risk to Others Homicidal Ideation: No Thoughts of Harm to Others: No Current Homicidal Intent: No Current Homicidal Plan: No Access to Homicidal Means: No Identified Victim: NA History of harm to others?: No Assessment of Violence: None Noted Violent Behavior Description:  (Pt was agitated during assessment. ) Does patient have access to weapons?: No Criminal Charges Pending?: No Does patient have a court date: No  Psychosis Hallucinations: None noted Delusions: None noted  Mental Status Report Appear/Hygiene: Unremarkable Eye Contact: Poor Motor Activity: Unremarkable Speech: Logical/coherent;Aggressive Level of Consciousness: Alert Mood: Angry;Irritable Affect: Irritable Anxiety Level: Minimal Thought Processes: Coherent;Relevant Judgement: Impaired Orientation: Person;Place;Time;Situation Obsessive Compulsive Thoughts/Behaviors: None  Cognitive Functioning Concentration: Normal Memory: Recent Intact;Remote Intact IQ: Average Insight: Fair Impulse Control: Fair Appetite: Fair Weight Loss:  (unk) Weight Gain:  (unk) Sleep: No Change Total Hours of Sleep:  (7) Vegetative Symptoms: None  ADLScreening [facility] Assessment Services) Patient's cognitive ability adequate to safely complete daily activities?: Yes Patient able to express need for assistance  with ADLs?: Yes Independently performs ADLs?: Yes (appropriate for developmental age)  Prior Inpatient Therapy Prior Inpatient Therapy: Yes Prior Therapy Dates:  (unk) Prior Therapy Facilty/Provider(s):  ([facility]) Reason for Treatment:  (substance use)  Prior Outpatient Therapy Prior Outpatient Therapy: No Prior Therapy Dates:  (NA) Prior Therapy Facilty/Provider(s):  (NA) Reason for Treatment:  (NA)  ADL Screening (condition at time of admission) Patient's cognitive ability adequate to safely complete daily activities?: Yes Is the patient deaf or have difficulty hearing?: No Does the patient have difficulty seeing, even when wearing glasses/contacts?: No Does the patient have difficulty concentrating, remembering, or making decisions?: No Patient able to express need for assistance with ADLs?: Yes Does the patient have difficulty dressing or bathing?: No Independently performs ADLs?: Yes (appropriate for developmental age)       Abuse/Neglect Assessment (Assessment to be complete while patient is alone) Physical Abuse: Denies Verbal Abuse: Denies Sexual Abuse: Denies Exploitation of patient/patient's resources: Denies Self-Neglect: Denies     Merchant navy officerAdvance Directives (For Healthcare) Advance Directive: Patient does not have advance directive    Additional Information 1:1 In Past 12 Months?: No CIRT Risk: No Elopement Risk: No Does patient have medical clearance?: Yes    Disposition: Clinician consulted with Donell SievertSpencer Simon, PA who states Pt does not meet criteria for inpatient admission if pt is able to contract for safety. Pt signed MSE. Pt contracted for safety. Pt provided outpatient referrals.    Disposition Initial Assessment Completed for this Encounter: Yes Disposition of Patient: Other dispositions Other disposition(s): Information only  Alric QuanDelilah R Stewart 10/23/2013 11:52 PM

## 2013-11-25 ENCOUNTER — Encounter (HOSPITAL_COMMUNITY): Payer: Self-pay | Admitting: Emergency Medicine

## 2013-11-25 ENCOUNTER — Emergency Department (HOSPITAL_COMMUNITY)
Admission: EM | Admit: 2013-11-25 | Discharge: 2013-11-27 | Disposition: A | Discharge status: Home / Self Care | Payer: Self-pay | Attending: Emergency Medicine | Admitting: Emergency Medicine

## 2013-11-25 ENCOUNTER — Emergency Department (HOSPITAL_COMMUNITY): Payer: Self-pay

## 2013-11-25 DIAGNOSIS — F17200 Nicotine dependence, unspecified, uncomplicated: Secondary | ICD-10-CM | POA: Insufficient documentation

## 2013-11-25 DIAGNOSIS — F39 Unspecified mood [affective] disorder: Secondary | ICD-10-CM | POA: Insufficient documentation

## 2013-11-25 DIAGNOSIS — F1920 Other psychoactive substance dependence, uncomplicated: Secondary | ICD-10-CM | POA: Insufficient documentation

## 2013-11-25 DIAGNOSIS — F1910 Other psychoactive substance abuse, uncomplicated: Secondary | ICD-10-CM

## 2013-11-25 DIAGNOSIS — G479 Sleep disorder, unspecified: Secondary | ICD-10-CM | POA: Insufficient documentation

## 2013-11-25 DIAGNOSIS — Z8619 Personal history of other infectious and parasitic diseases: Secondary | ICD-10-CM | POA: Insufficient documentation

## 2013-11-25 DIAGNOSIS — R45851 Suicidal ideations: Secondary | ICD-10-CM | POA: Insufficient documentation

## 2013-11-25 DIAGNOSIS — Z880 Allergy status to penicillin: Secondary | ICD-10-CM | POA: Insufficient documentation

## 2013-11-25 LAB — COMPREHENSIVE METABOLIC PANEL
ALBUMIN: 3.4 g/dL — AB (ref 3.5–5.2)
ALT: 95 U/L — ABNORMAL HIGH (ref 0–53)
ANION GAP: 14 (ref 5–15)
AST: 59 U/L — AB (ref 0–37)
Alkaline Phosphatase: 91 U/L (ref 39–117)
BUN: 12 mg/dL (ref 6–23)
CALCIUM: 8.8 mg/dL (ref 8.4–10.5)
CO2: 23 mEq/L (ref 19–32)
Chloride: 103 mEq/L (ref 96–112)
Creatinine, Ser: 0.96 mg/dL (ref 0.50–1.35)
GFR calc Af Amer: >90 mL/min (ref >90)
GFR calc non Af Amer: >90 mL/min (ref >90)
Glucose, Bld: 106 mg/dL — ABNORMAL HIGH (ref 70–99)
Potassium: 4 mEq/L (ref 3.7–5.3)
Sodium: 140 mEq/L (ref 137–147)
TOTAL PROTEIN: 7.2 g/dL (ref 6.0–8.3)
Total Bilirubin: 0.4 mg/dL (ref 0.3–1.2)

## 2013-11-25 LAB — CBC
HEMATOCRIT: 39.1 % (ref 39.0–52.0)
Hemoglobin: 13.5 g/dL (ref 13.0–17.0)
MCH: 30.3 pg (ref 26.0–34.0)
MCHC: 34.5 g/dL (ref 30.0–36.0)
MCV: 87.9 fL (ref 78.0–100.0)
PLATELETS: 263 10*3/uL (ref 150–400)
RBC: 4.45 MIL/uL (ref 4.22–5.81)
RDW: 13.1 % (ref 11.5–15.5)
WBC: 9.3 10*3/uL (ref 4.0–10.5)

## 2013-11-25 LAB — ETHANOL

## 2013-11-25 LAB — RAPID URINE DRUG SCREEN, HOSP PERFORMED
Amphetamines: POSITIVE — AB
Barbiturates: NOT DETECTED
Benzodiazepines: NOT DETECTED
Cocaine: POSITIVE — AB
Opiates: POSITIVE — AB
Tetrahydrocannabinol: POSITIVE — AB

## 2013-11-25 LAB — SALICYLATE LEVEL

## 2013-11-25 LAB — ACETAMINOPHEN LEVEL: Acetaminophen (Tylenol), Serum: <15 ug/mL (ref 10–30)

## 2013-11-25 MED ORDER — ACETAMINOPHEN 325 MG PO TABS: 650.0000 mg | ORAL_TABLET | ORAL | Status: DC | PRN

## 2013-11-25 MED ORDER — LORAZEPAM 1 MG PO TABS
1.0000 mg | ORAL_TABLET | Freq: Three times a day (TID) | ORAL | Status: DC | PRN
  Administered 2013-11-25 – 2013-11-26 (×2): 1 mg via ORAL
  Filled 2013-11-25 (×2): qty 1

## 2013-11-25 NOTE — ED Notes (Signed)
Pt escorted with personal belonging bag to Pod C by staff. Report given to TroyPaul, RN [place].

## 2013-11-25 NOTE — ED Notes (Signed)
Pt [facility] and was diagnosed with a collapsed lung. States that he left AMA and never got it treated. Complaining of lung pain at this time.

## 2013-11-25 NOTE — ED Provider Notes (Signed)
CSN: [id number]     Arrival date & time 11/25/13  2054 History   First MD Initiated Contact with Patient 11/25/13 2116     Chief Complaint  Patient presents with  . Psychiatric Evaluation     (Consider location/radiation/quality/duration/timing/severity/associated sxs/prior Treatment) HPI Patient presents with request for assistance with detoxification from crystal meth, heroine. Patient uses frequent, including earlier today. Patient denies physical complaints. He does state that he occasionally has right-sided chest pain, from a previously injured .  He acknowledges a history of hepatitis C, and prior attempts at cessation. Patient injects into his arms, denies current fevers, discharge, bleeding from any entry site.    Past Medical History  Diagnosis Date  . Hepatitis C virus   . Heroin addiction   . Mental disorder   . Depression    History reviewed. No pertinent past surgical history. Family History  Problem Relation Age of Onset  . Hypertension Other    History  Substance Use Topics  . Smoking status: Current Every Day Smoker -- 1.00 packs/day    Types: Cigarettes  . Smokeless tobacco: Not on file  . Alcohol Use: Yes     Comment: occ    Review of Systems  Constitutional:       Per HPI, otherwise negative  HENT:       Per HPI, otherwise negative  Respiratory:       Per HPI, otherwise negative  Cardiovascular:       Per HPI, otherwise negative  Gastrointestinal: Negative for vomiting.  Endocrine:       Negative aside from HPI  Genitourinary:       Neg aside from HPI   Musculoskeletal:       Per HPI, otherwise negative  Skin: Positive for wound.  Neurological: Negative for syncope.  Psychiatric/Behavioral: Positive for suicidal ideas, sleep disturbance, self-injury and dysphoric mood.      Allergies  Ceclor and Penicillins  Home Medications   Prior to Admission medications   Not on File   BP 126/84  Pulse 100  Temp(Src) 97.7 F (36.5 C)  (Oral)  Resp 16  Ht 6\' 1"  (1.854 m)  Wt 179 lb 8 oz (81.421 kg)  BMI 23.69 kg/m2  SpO2 100% Physical Exam  Nursing note and vitals reviewed. Constitutional: He is oriented to person, place, and time. He appears well-developed. No distress.  HENT:  Head: Normocephalic and atraumatic.  Eyes: Conjunctivae and EOM are normal.  Cardiovascular: Normal rate and regular rhythm.   Pulmonary/Chest: Effort normal. No stridor. No respiratory distress.  Abdominal: He exhibits no distension.  Musculoskeletal: He exhibits no edema.  Neurological: He is alert and oriented to person, place, and time.  Skin: Skin is warm and dry.  Multiple and site on both arms.  No active draining, please, confluent erythema  Psychiatric: He has a normal mood and affect. He expresses suicidal ideation. He expresses no suicidal plans.    ED Course  Procedures (including critical care time)  I reviewed all labs, x-ray, EMR.    MDM   Patient presents for assistance with heroin and crystal methadone abuse.  Patient also endorses suicidal thoughts. Patient is medically clear for additional assistance from psychiatry.     Gerhard Munchobert Phinehas Grounds, MD 11/25/13 2201

## 2013-11-25 NOTE — BH Assessment (Signed)
Assessment complete. Clint Bolderori Beck, [facility], confirms adult unit is at capacity. Gave clinical report to Alberteen SamFran Hobson, NP who said Pt meets inpatient criteria. TTS will contact other facilities for placement. Notified Dr. Pricilla LovelessScott Goldston and Lavone NianPaul Smith, RN of recommendation.  Harlin RainFord Ellis Ria CommentWarrick Jr, LPC, [facility] Triage Specialist [phone]

## 2013-11-25 NOTE — BH Assessment (Signed)
Tele Assessment Note   Martin Allen is an 33 y.o. male, single, Caucasian who presents unaccompanied to [facility] ED requesting substance abuse treatment. Pt reports he has been using intravenous crystal meth for the past year. 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 He reports using 1-2 grams or more daily. Pt states he sometimes doesn't even let the chemicals burn off before injecting. Pt reports he started using intravenous heroin again approximately one year ago to balance the meth and is using approximately 1/2 gram daily. Pt report his last use of crystal meth was yesterday and last use of heroin was five hours ago. He reports when he withdraws he experiences cramps, sweats, chills, irritability and "it feels like the flu." He reports using marijuana daily for years. Pt states he will occasionally use narcotics, benzodiazepines and alcohol but primarily is using crystal meth, heroin and marijuana. He denies history of blackouts or seizures. Pt states he is seeking treatment at this time because he fears for his health. Pt has hepatitis C and has been sharing needles and other high risk behaviors for disease.  Pt reports depressive symptoms related to his substance abuse. He report erratic sleep and appetite, irritability, anhedonia, anxiety and feeling of guilt and hopelessness. Pt reports passive suicidal ideation with no plan or intent. Pt denies any history of suicide attempts other than having no regard for his personal wellbeing when using. Pt is homeless and living on the street. He is unemployed and has no real support other than his elderly grandparents, who will not provide any housing or money. Pt denies current legal problems. Pt states he has been in treatment at [facility], [facility] and [facility]. He reports his last treatment was at [facility] in January 2015 and he left AMA.  Pt is  dressed in a hospital gown, alert, oriented x4 with rapid speech and restless motor behavior. Eye contact is good. Pt's mood is anxious and guilty and affect is congruent with mood. Thought process is coherent and relevant. There is no indication Pt is currently responding to internal stimuli or experiencing delusional thought content. Pt was cooperative throughout assessment. He is requesting inpatient detox treatment followed by long term residential treatment.    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 Hepatitis C virus   . Heroin addiction   . Mental disorder   . Depression    Axis IV: economic problems, housing problems, occupational problems, other psychosocial or environmental problems, problems with access to health care services and problems with primary support group Axis V: GAF=30  Past Medical History:  Past Medical History  Diagnosis Date  . Hepatitis C virus   . Heroin addiction   . Mental disorder   . Depression     History reviewed. No pertinent past surgical history.  Family History:  Family History  Problem Relation Age of Onset  . Hypertension Other     Social History:  reports that he has been smoking Cigarettes.  He has been smoking about 1.00 pack per day. He does not have any smokeless tobacco history on file. He reports that he drinks alcohol. He reports that he uses illicit drugs (Marijuana, Heroin, Cocaine, and Methamphetamines).  Additional Social History:  Alcohol / Drug Use Pain Medications: History of abusing various narcotics Prescriptions: Pt has no prescription Over the Counter: Uses OTC medications to manufacture crystal meth History of alcohol / drug use?: Yes Longest period of sobriety (when/how long): 12/2007- 05/2008 Negative Consequences of Use: Financial;Personal  relationships;Work / Programmer, multimediachool Withdrawal Symptoms: Fever / Chills;Cramps;Irritability;Sweats;Weakness;Nausea / Vomiting Substance #1 Name of Substance 1: Crystal meth (I.V.) 1 - Age of First Use: 31 1 - Amount (size/oz): 1-2 grams or more 1 - Frequency: daily 1 - Duration: One year 1 - Last Use / Amount: 11/24/13, 2 grams Substance #2 Name of Substance 2: Heroin (I.V.) 2 - Age of First Use: 23 2 - Amount (size/oz): 1/2 gram 2 - Frequency: daily 2 - Duration: One year this episode 2 - Last Use / Amount: 11/25/13, 1/2 gram Substance #3 Name of Substance 3: Marijuana 3 - Age of First Use: 16 3 - Amount (size/oz): Varies 3 - Frequency: daily 3 - Duration: ongoing 3 - Last Use / Amount: 11/25/13  CIWA: CIWA-Ar BP: 125/82 mmHg Pulse Rate: 78 Nausea and Vomiting: no nausea and no vomiting Tactile Disturbances: none Tremor: no tremor Auditory Disturbances: not present Paroxysmal Sweats: no sweat visible Visual Disturbances: not present Anxiety: mildly anxious Headache, Fullness in Head: none present Agitation: normal activity Orientation and Clouding of Sensorium: oriented and can do serial additions CIWA-Ar Total: 1 COWS: Clinical Opiate Withdrawal Scale (COWS) Resting Pulse Rate: Pulse Rate 80 or below Sweating: No report of chills or flushing Restlessness: Reports difficulty sitting still, but is able to do so Pupil Size: Pupils pinned or normal size for room light Bone or Joint Aches: Not present Runny Nose or Tearing: Not present GI Upset: No GI symptoms Tremor: Tremor can be felt, but not observed Yawning: No yawning Anxiety or Irritability: Patient obviously irritable/anxious Gooseflesh Skin: Skin is smooth COWS Total Score: 4  Allergies:  Allergies  Allergen Reactions  . Ceclor [Cefaclor] Itching and Rash  . Penicillins Itching and Rash    Home Medications:  (Not in a hospital admission)  OB/GYN Status:  No LMP for male patient.  General Assessment  [facility] Is this a Tele or Face-to-Face Assessment?: Tele Assessment Is this an Initial Assessment or a Re-assessment for this encounter?: Initial Assessment Living Arrangements: Other (Comment) (Homeless, on street) Can pt return to current living arrangement?: Yes Admission Status: Voluntary Is patient capable of signing voluntary admission?: Yes Transfer from: Home Referral Source: Self/Family/Friend     [facility] Living Arrangements: Other (Comment) (Homeless, on street) Name of Psychiatrist: None Name of Therapist: None  Education Status Is patient currently in school?: No Current Grade: NA Highest grade of school patient has completed: NA Name of school: NA Contact person: NA  Risk to self Suicidal Ideation: Yes-Currently Present Suicidal Intent: No Is patient at risk for suicide?: No Suicidal Plan?: No Access to Means: No What has been your use of drugs/alcohol within the last 12 months?: Pt using various drugs Previous Attempts/Gestures: No How many times?: 0 Other Self Harm Risks: None Triggers for Past Attempts: None known Intentional Self Injurious Behavior: None Family Suicide History: No Recent stressful life event(s): Financial Problems;Other (Comment) (Homeless) Persecutory voices/beliefs?: No Depression: Yes Depression Symptoms: Despondent;Guilt;Fatigue;Insomnia;Feeling worthless/self pity;Feeling angry/irritable;Loss of interest in usual pleasures Substance abuse history and/or treatment for substance abuse?: Yes Suicide prevention information given to non-admitted patients: Not applicable  Risk to Others Homicidal Ideation: No Thoughts of Harm to Others: No Current Homicidal  Intent: No Current Homicidal Plan: No Access to Homicidal Means: No Identified Victim: None History of harm to others?: No Assessment of Violence: None Noted Violent Behavior Description: Pt denies history of violence Does patient have  access to weapons?: No Criminal Charges Pending?: No Does patient have a court date: No  Psychosis Hallucinations: None noted Delusions: None noted  Mental Status Report Appear/Hygiene: In scrubs Eye Contact: Fair Motor Activity: Hyperactivity;Restlessness Speech: Rapid Level of Consciousness: Alert Mood: Anxious;Guilty Affect: Appropriate to circumstance;Anxious Anxiety Level: Moderate Thought Processes: Coherent;Relevant Judgement: Unimpaired Orientation: Person;Time;Place;Situation Obsessive Compulsive Thoughts/Behaviors: None  Cognitive Functioning Concentration: Decreased Memory: Recent Intact;Remote Intact IQ: Average Insight: Fair Impulse Control: Fair Appetite: Poor Weight Loss: 10 Weight Gain: 0 Sleep: Decreased Total Hours of Sleep: 4 Vegetative Symptoms: Decreased grooming  ADLScreening [facility] Assessment Services) Patient's cognitive ability adequate to safely complete daily activities?: Yes Patient able to express need for assistance with ADLs?: Yes Independently performs ADLs?: Yes (appropriate for developmental age)  Prior Inpatient Therapy Prior Inpatient Therapy: Yes Prior Therapy Dates: 05/2013, multiple admits Prior Therapy Facilty/Provider(s): [facility], [facility], [facility] Reason for Treatment: Substance abuse  Prior Outpatient Therapy Prior Outpatient Therapy: No Prior Therapy Dates: NA Prior Therapy Facilty/Provider(s): NA Reason for Treatment: NA  ADL Screening (condition at time of admission) Patient's cognitive ability adequate to safely complete daily activities?: Yes Is the patient deaf or have difficulty hearing?: No Does the patient have difficulty seeing, even when wearing glasses/contacts?: No Does the patient have difficulty concentrating, remembering, or making decisions?: No Patient able to express need for assistance with ADLs?: Yes Does the patient have difficulty dressing or bathing?: No Independently performs ADLs?: Yes (appropriate  for developmental age) Does the patient have difficulty walking or climbing stairs?: No Weakness of Legs: None Weakness of Arms/Hands: None  Home Assistive Devices/Equipment Home Assistive Devices/Equipment: None    Abuse/Neglect Assessment (Assessment to be complete while patient is alone) Physical Abuse: Denies Verbal Abuse: Denies Sexual Abuse: Denies Exploitation of patient/patient's resources: Denies Self-Neglect: Denies Values / Beliefs Cultural Requests During Hospitalization: None Spiritual Requests During Hospitalization: None   Advance Directives (For Healthcare) Advance Directive: Patient does not have advance directive;Patient would not like information Pre-existing out of facility DNR order (yellow form or pink MOST form): No Nutrition Screen- [facility] Patient's home diet: Regular  Additional Information 1:1 In Past 12 Months?: No CIRT Risk: No Elopement Risk: No Does patient have medical clearance?: Yes     Disposition: Clint Bolderori Beck[facility], confirms adult unit is at capacity. Gave clinical report to Alberteen SamFran Hobson, NP who said Pt meets inpatient criteria. TTS will contact other facilities for placement. Notified Dr. Pricilla LovelessScott Goldston and Lavone NianPaul Smith, RN of recommendation.  Disposition Initial Assessment Completed for this Encounter: Yes Disposition of Patient: Other dispositions Other disposition(s):  ([facility] at capacity. Contact other facilities for placement.)  Harlin RainFord Ellis Patsy BaltimoreWarrick Jr, [facility], [facility] Triage Specialist [phone]   Pamalee LeydenWarrick Jr, Dalis Beers Ellis 11/25/2013 11:50 PM

## 2013-11-25 NOTE — BH Assessment (Signed)
Received call for assessment. Attempted three times to contact ED physician without success. To avoid further delay tele-assessment was initiated.  Harlin RainFord Ellis Ria CommentWarrick Jr, LPC, [facility] Triage Specialist [phone]

## 2013-11-25 NOTE — ED Notes (Signed)
He wants detox from heroin chrystal meth and other drugs.  His last heroin use was one hour ago

## 2013-11-25 NOTE — ED Notes (Signed)
Patient transported to X-ray 

## 2013-11-26 DIAGNOSIS — F1910 Other psychoactive substance abuse, uncomplicated: Secondary | ICD-10-CM

## 2013-11-26 DIAGNOSIS — F1994 Other psychoactive substance use, unspecified with psychoactive substance-induced mood disorder: Secondary | ICD-10-CM

## 2013-11-26 DIAGNOSIS — F332 Major depressive disorder, recurrent severe without psychotic features: Secondary | ICD-10-CM

## 2013-11-26 MED ORDER — CLONIDINE HCL 0.1 MG PO TABS
0.1000 mg | ORAL_TABLET | Freq: Once | ORAL | Status: AC
  Administered 2013-11-26: 0.1 mg via ORAL
  Filled 2013-11-26: qty 1

## 2013-11-26 NOTE — BH Assessment (Addendum)
*   [facility]  and she reports that she will call me back to let me know if they have detox beds    Available. Pt not appropriate for [facility] at this time,per [facility] assessment pt is endorsing SI and not able to contract for     safety as of 11/25/13.   * Spoke with Leeanne Mannanarolyn Carter at [facility] who reports bed availability. Pt not appropriate for [facility] at this time,per [facility] assessment pt is endorsing SI and not able to contract for safety as of 11/25/13. Pt will be reassessed by Renata Capriceonrad W-FNP today to determine further disposition. Pt's Telepsych consult is scheduled for 1530 today.   Glorious PeachNajah Matvey Llanas, MS, LCASA Assessment Counselor

## 2013-11-26 NOTE — ED Notes (Signed)
Pt extremely irritable about having to redo TelePsy. Pt denies every being suicidal or having suicidal thoughts; Pt states he just wants to get help with detox and drug addiction; pt states he wants to sign himself out immediately; Papers drawn up and then pt reconsiders and states he is willing to stay; Pt upset because he feels beds may be filled up.

## 2013-11-26 NOTE — Consult Note (Signed)
Telepsych Consultation   Reason for Consult:  Detox, possible SI Referring Physician:  EDP Martin Allen is an 33 y.o. male.  Assessment: AXIS I:  Major Depression, Recurrent severe, Substance Abuse and Substance Induced Mood Disorder AXIS II:  Deferred AXIS III:   Past Medical History  Diagnosis Date  . Hepatitis C virus   . Heroin addiction   . Mental disorder   . Depression    AXIS IV:  other psychosocial or environmental problems and problems related to social environment AXIS V:  41-50 serious symptoms  Plan:  Recommend psychiatric Inpatient admission when medically cleared.  Subjective:   Martin Allen is a 33 y.o. male patient admitted with complaints of opiate, benzo, and methamphetamine addiction. Pt stated mild SI upon admission but this has resolved. Pt currently denies SI, HI, and [facility], contracts for safety. Pt wants residential detox from multiple substances and is open to referrals.   HPI:  Martin Allen is an 33 y.o. male, single, Caucasian who [facility]accompanied [facility] requesting substance abuse treatment. Pt reports he has been using intravenous crystal meth for the past year. 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 He reports using 1-2 grams or more daily. Pt states he sometimes doesn't even let the chemicals burn off before injecting. Pt reports he started using intravenous heroin again approximately one year ago to balance the meth and is using approximately 1/2 gram daily. Pt report his last use of crystal meth was yesterday and last use of heroin was five hours ago. He reports when he withdraws he experiences cramps, sweats, chills, irritability and "it feels like the flu." He reports using marijuana daily for years. Pt states he will occasionally use narcotics, benzodiazepines and alcohol but primarily is using crystal meth, heroin and  marijuana. He denies history of blackouts or seizures. Pt states he is seeking treatment at this time because he fears for his health. Pt has hepatitis C and has been sharing needles and other high risk behaviors for disease.  Pt reports depressive symptoms related to his substance abuse. He report erratic sleep and appetite, irritability, anhedonia, anxiety and feeling of guilt and hopelessness. Pt reports passive suicidal ideation with no plan or intent. Pt denies any history of suicide attempts other than having no regard for his personal wellbeing when using. Pt is homeless and living on the street. He is unemployed and has no real support other than his elderly grandparents, who will not provide any housing or money. Pt denies current legal problems. Pt states he has been in treatment at [facility], [facility] and [facility]. He reports his last treatment was at [facility] in January 2015 and he left AMA.  Pt is dressed in a hospital gown, alert, oriented x4 with rapid speech and restless motor behavior. Eye contact is good. Pt's mood is anxious and guilty and affect is congruent with mood. Thought process is coherent and relevant. There is no indication Pt is currently responding to internal stimuli or experiencing delusional thought content. Pt was cooperative throughout assessment. He is requesting inpatient detox treatment followed by long term residential treatment.  HPI Elements:   Location:  Generalized, [facility]. Quality:  Worsening. Severity:  Severe. Timing:  Constant. Duration:  Chronic. Context:  Exacerbation of underlying addictiive behaviors.  Past Psychiatric History: Past Medical History  Diagnosis Date  .  Hepatitis C virus   . Heroin addiction   . Mental disorder   . Depression     reports that he has been smoking Cigarettes.  He has been smoking about 1.00 pack per day. He does not have any smokeless tobacco history on file. He reports that he drinks alcohol. He reports that he uses illicit drugs  (Marijuana, Heroin, Cocaine, and Methamphetamines). Family History  Problem Relation Age of Onset  . Hypertension Other    Family History Substance Abuse: Yes, Describe: (Father had history of substance abuse) Family Supports: Yes, List: (Grandparents) Living Arrangements: Other (Comment) (Homeless, on street) Can pt return to current living arrangement?: Yes Allergies:   Allergies  Allergen Reactions  . Ceclor [Cefaclor] Itching and Rash  . Penicillins Itching and Rash    ACT Assessment Complete:  Yes:    Educational Status    Risk to Self: Risk to self Suicidal Ideation: Yes-Currently Present Suicidal Intent: No Is patient at risk for suicide?: No Suicidal Plan?: No Access to Means: No What has been your use of drugs/alcohol within the last 12 months?: Pt using various drugs Previous Attempts/Gestures: No How many times?: 0 Other Self Harm Risks: None Triggers for Past Attempts: None known Intentional Self Injurious Behavior: None Family Suicide History: No Recent stressful life event(s): Financial Problems;Other (Comment) (Homeless) Persecutory voices/beliefs?: No Depression: Yes Depression Symptoms: Despondent;Guilt;Fatigue;Insomnia;Feeling worthless/self pity;Feeling angry/irritable;Loss of interest in usual pleasures Substance abuse history and/or treatment for substance abuse?: Yes Suicide prevention information given to non-admitted patients: Not applicable  Risk to Others: Risk to Others Homicidal Ideation: No Thoughts of Harm to Others: No Current Homicidal Intent: No Current Homicidal Plan: No Access to Homicidal Means: No Identified Victim: None History of harm to others?: No Assessment of Violence: None Noted Violent Behavior Description: Pt denies history of violence Does patient have access to weapons?: No Criminal Charges Pending?: No Does patient have a court date: No  Abuse: Abuse/Neglect Assessment (Assessment to be complete while patient is  alone) Physical Abuse: Denies Verbal Abuse: Denies Sexual Abuse: Denies Exploitation of patient/patient's resources: Denies Self-Neglect: Denies  Prior Inpatient Therapy: Prior Inpatient Therapy Prior Inpatient Therapy: Yes Prior Therapy Dates: 05/2013, multiple admits Prior Therapy Facilty/Provider(s): [facility], [facility], [facility] Reason for Treatment: Substance abuse  Prior Outpatient Therapy: Prior Outpatient Therapy Prior Outpatient Therapy: No Prior Therapy Dates: NA Prior Therapy Facilty/Provider(s): NA Reason for Treatment: NA  Additional Information: Additional Information 1:1 In Past 12 Months?: No CIRT Risk: No Elopement Risk: No Does patient have medical clearance?: Yes                  Objective: Blood pressure 120/99, pulse 93, temperature 98 F (36.7 C), temperature source Oral, resp. rate 18, height $RemoveBe'6\' 1"'fVZYafYXP$  (1.854 m), weight 81.421 kg (179 lb 8 oz), SpO2 98.00%.Body mass index is 23.69 kg/(m^2). Results for orders placed during the hospital encounter of 11/25/13 (from the past 72 hour(s))  URINE RAPID DRUG SCREEN (HOSP PERFORMED)     Status: Abnormal   Collection Time    11/25/13  9:30 PM      Result Value Ref Range   Opiates POSITIVE (*) NONE DETECTED   Cocaine POSITIVE (*) NONE DETECTED   Benzodiazepines NONE DETECTED  NONE DETECTED   Amphetamines POSITIVE (*) NONE DETECTED   Tetrahydrocannabinol POSITIVE (*) NONE DETECTED   Barbiturates NONE DETECTED  NONE DETECTED   Comment:            DRUG SCREEN FOR MEDICAL PURPOSES  ONLY.  IF CONFIRMATION IS NEEDED     FOR ANY PURPOSE, NOTIFY LAB     WITHIN 5 DAYS.                LOWEST DETECTABLE LIMITS     FOR URINE DRUG SCREEN     Drug Class       Cutoff (ng/mL)     Amphetamine      1000     Barbiturate      200     Benzodiazepine   295     Tricyclics       621     Opiates          300     Cocaine          300     THC              50  ACETAMINOPHEN LEVEL     Status: None   Collection Time     11/25/13  9:49 PM      Result Value Ref Range   Acetaminophen (Tylenol), Serum <15.0  10 - 30 ug/mL   Comment:            THERAPEUTIC CONCENTRATIONS VARY     SIGNIFICANTLY. A RANGE OF 10-30     ug/mL MAY BE AN EFFECTIVE     CONCENTRATION FOR MANY PATIENTS.     HOWEVER, SOME ARE BEST TREATED     AT CONCENTRATIONS OUTSIDE THIS     RANGE.     ACETAMINOPHEN CONCENTRATIONS     >150 ug/mL AT 4 HOURS AFTER     INGESTION AND >50 ug/mL AT 12     HOURS AFTER INGESTION ARE     OFTEN ASSOCIATED WITH TOXIC     REACTIONS.  CBC     Status: None   Collection Time    11/25/13  9:49 PM      Result Value Ref Range   WBC 9.3  4.0 - 10.5 K/uL   RBC 4.45  4.22 - 5.81 MIL/uL   Hemoglobin 13.5  13.0 - 17.0 g/dL   HCT 39.1  39.0 - 52.0 %   MCV 87.9  78.0 - 100.0 fL   MCH 30.3  26.0 - 34.0 pg   MCHC 34.5  30.0 - 36.0 g/dL   RDW 13.1  11.5 - 15.5 %   Platelets 263  150 - 400 K/uL  COMPREHENSIVE METABOLIC PANEL     Status: Abnormal   Collection Time    11/25/13  9:49 PM      Result Value Ref Range   Sodium 140  137 - 147 mEq/L   Potassium 4.0  3.7 - 5.3 mEq/L   Chloride 103  96 - 112 mEq/L   CO2 23  19 - 32 mEq/L   Glucose, Bld 106 (*) 70 - 99 mg/dL   BUN 12  6 - 23 mg/dL   Creatinine, Ser 0.96  0.50 - 1.35 mg/dL   Calcium 8.8  8.4 - 10.5 mg/dL   Total Protein 7.2  6.0 - 8.3 g/dL   Albumin 3.4 (*) 3.5 - 5.2 g/dL   AST 59 (*) 0 - 37 U/L   ALT 95 (*) 0 - 53 U/L   Alkaline Phosphatase 91  39 - 117 U/L   Total Bilirubin 0.4  0.3 - 1.2 mg/dL   GFR calc non Af Amer >90  >90 mL/min   GFR calc Af Amer >90  >90 mL/min   Comment: (  NOTE)     The eGFR has been calculated using the CKD EPI equation.     This calculation has not been validated in all clinical situations.     eGFR's persistently <90 mL/min signify possible Chronic Kidney     Disease.   Anion gap 14  5 - 15  ETHANOL     Status: None   Collection Time    11/25/13  9:49 PM      Result Value Ref Range   Alcohol, Ethyl (B) <11  0 - 11  mg/dL   Comment:            LOWEST DETECTABLE LIMIT FOR     SERUM ALCOHOL IS 11 mg/dL     FOR MEDICAL PURPOSES ONLY  SALICYLATE LEVEL     Status: Abnormal   Collection Time    11/25/13  9:49 PM      Result Value Ref Range   Salicylate Lvl <0.3 (*) 2.8 - 20.0 mg/dL   Labs are reviewed and are pertinent for UDS + for coc, opiates, THC.   Current Facility-Administered Medications  Medication Dose Route Frequency Provider Last Rate Last Dose  . acetaminophen (TYLENOL) tablet 650 mg  650 mg Oral Q4H PRN Carmin Muskrat, MD      . LORazepam (ATIVAN) tablet 1 mg  1 mg Oral Q8H PRN Carmin Muskrat, MD   1 mg at 11/25/13 2354   No current outpatient prescriptions on file.    Psychiatric Specialty Exam:     Blood pressure 120/99, pulse 93, temperature 98 F (36.7 C), temperature source Oral, resp. rate 18, height $RemoveBe'6\' 1"'LChtaivnG$  (1.854 m), weight 81.421 kg (179 lb 8 oz), SpO2 98.00%.Body mass index is 23.69 kg/(m^2).  General Appearance: Fairly Groomed  Engineer, water::  Fair  Speech:  Clear and Coherent  Volume:  Normal  Mood:  Anxious and Depressed  Affect:  Appropriate and Depressed  Thought Process:  Coherent and Goal Directed  Orientation:  Full (Time, Place, and Person)  Thought Content:  WDL  Suicidal Thoughts:  No  Homicidal Thoughts:  No  Memory:  Immediate;   Fair Recent;   Fair Remote;   Fair  Judgement:  Fair  Insight:  Fair  Psychomotor Activity:  Normal  Concentration:  Fair  Recall:  Fair  Akathisia:  No  Handed:    AIMS (if indicated):     Assets:  Communication Skills Desire for Improvement Resilience  Sleep:      Treatment Plan Summary: Continue to find referrals for residential treatment for detox such as [facility], [facility], [facility], or other appropriate facilities.   Disposition: Disposition Initial Assessment Completed for this Encounter: Yes Disposition of Patient: Other dispositions Other disposition(s):  ([facility] at capacity. Contact other facilities for  placement.)  Benjamine Mola, FNP-BC 11/26/2013 4:16 PM

## 2013-11-27 MED ORDER — LOPERAMIDE HCL 2 MG PO CAPS: 2.0000 mg | ORAL_CAPSULE | ORAL | Status: DC | PRN

## 2013-11-27 MED ORDER — LOPERAMIDE HCL 2 MG PO CAPS: 4.0000 mg | ORAL_CAPSULE | Freq: Once | ORAL | Status: DC

## 2013-11-27 NOTE — ED Provider Notes (Signed)
10:57 AM No homicidal or suicidal thoughts.  Patient has polysubstance abuse issues.  There's been some difficulty trying to find placement for him and therefore I have chosen to discharge the patient home with outpatient resources.  I offered to prescribe him clonidine, Ambien, Phenergan.  He does not want these prescriptions and would just like to go home at this time.  Lyanne CoKevin M Aitan Rossbach, MD 11/27/13 1058

## 2013-11-27 NOTE — ED Notes (Signed)
Patient refused to sign any discharge papers, refused to have discharge vitals, refused to wait for discharge instructions. Pt states "have a f*&^%ing nice day and walked out

## 2013-11-27 NOTE — ED Notes (Signed)
Patient with underlying irritability. In to fill out [facility] assessment with him and he is asking about going to [facility]. "i am pretty much detoxed and would like to go there".

## 2013-11-27 NOTE — ED Notes (Signed)
SPOKE TO AVA AT [facility]. ADVISED HER THAT PT IS NOT A CANIDATE FOR [facility] PER JEFF. STATES PT HAS BEEN TO THEIR FACILITY 5-6 TIMES AND EACH TIME HE HAS LEFT BEFORE COMPLETING THE PROGRAM. THE NEXT TIME PT CAN BE CONSIDERED IS IN AUGUST BUT WILL REQUIRE DIRECTOR APPROVAL DUE TO HIS HISTORY OF BEHAVIORS. AVA ALSO ADVISES THAT DR Elsie SaasJONNALAGADDA WILL BE BY HERE TODAY TO REEVAL PATIENT AND MAKE DISPO SUGGESTIONS

## 2013-11-27 NOTE — ED Notes (Signed)
Social work consult completed, Doris, CSW at bedside

## 2013-11-27 NOTE — ED Notes (Signed)
Dr Patria Manecampos in to see pt

## 2013-11-27 NOTE — ED Notes (Signed)
Called [facility]. They do have male detox beds available for [place]. They will call back in about 30 min about pt

## 2013-11-27 NOTE — Discharge Instructions (Signed)
°Emergency Department Resource Guide °1) Find a Doctor and Pay Out of Pocket °Although you won't have to find out who is covered by your insurance plan, it is a good idea to ask around and get recommendations. You will then need to call the office and see if the doctor you have chosen will accept you as a new patient and what types of options they offer for patients who are self-pay. Some doctors offer discounts or will set up payment plans for their patients who do not have insurance, but you will need to ask so you aren't surprised when you get to your appointment. ° °2) Contact Your Local Health Department °Not all health departments have doctors that can see patients for sick visits, but many do, so it is worth a call to see if yours does. If you don't know where your local health department is, you can check in your phone book. The CDC also has a tool to help you locate your state's health department, and many state websites also have listings of all of their local health departments. ° °3) Find a Walk-in Clinic °If your illness is not likely to be very severe or complicated, you may want to try a walk in clinic. These are popping up all over the country in pharmacies, drugstores, and shopping centers. They're usually staffed by nurse practitioners or physician assistants that have been trained to treat common illnesses and complaints. They're usually fairly quick and inexpensive. However, if you have serious medical issues or chronic medical problems, these are probably not your best option. ° °No Primary Care Doctor: °- Call [facility] at  832-8000 - they can help you locate a primary care doctor that  accepts your insurance, provides certain services, etc. °- Physician Referral Service- 1-800-533-3463 ° °Chronic Pain Problems: °Organization         Address  Phone   Notes  °[facility]  (336) 297-2271 Patients need to be referred by their primary care doctor.  ° °Medication  Assistance: °Organization         Address  Phone   Notes  °[place] [facility] [address] °[place], [place] [address] (336) 641-8030 --Must be a resident of [place] °-- Must have NO insurance coverage whatsoever (no Medicaid/ Medicare, etc.) °-- The pt. MUST have a primary care doctor that directs their care regularly and follows them in the community °  °[facility]  (866) 331-1348   °[employer]  (888) 892-1162   ° °Agencies that provide inexpensive medical care: °Organization         Address  Phone   Notes  °[facility]  (336) 832-8035   °[facility]    (336) 832-7272   °[facility] [address] °[place], [place] [address] (336) 832-4777   °[facility] [address], °[place] (336) 271-4999   °[facility]    (336) 373-0678   °[facility]    (336) 272-1050   °[facility] ° [address], [place] Phone:  (336) 832-4444, Fax:  (336) 832-4440 Hours of Operation:  9 am - 6 pm, M-F.  Also accepts Medicaid/Medicare and self-pay.  °[facility] ° [address],  Phone: (336) 832-3150, Fax: (336) 832-3151. Hours of Operation:  8:30 am - 5:30 pm, M-F.  Also accepts Medicaid and self-pay.  °[facility] [place] [address], [place] Phone: (336) 878-6027   °[facility] [address], [place], [place] (336)723-1848, Ext. 123 Mondays & Thursdays: 7-9 AM.  First 15 patients are seen on a first come, first serve basis. °  ° °Medicaid-accepting [place] Providers: ° °Organization         Address  Phone   Notes  °[facility] [address], [place] (336) 641-2100 Also accepts self-pay patients.  °[facility] [address], [place] ° (336) 856-9996   °[facility] [address], [place]  (336) 288-8857   °[facility] [address] [place] [address], [place] (336) 299-7000   °Veita Bland [address], [place]  ° (336) 373-1557 Only accepts [place] Access Medicaid patients after they have their name applied to their card.  ° °Self-Pay (no insurance) in [place]: ° °Organization         Address  Phone   Notes  °Sickle Cell Patients, [facility] [address], [place] (336) 832-1970   °[facility] [address], [place] (336) 832-4400   °[facility] ° [address], [place] (336) 992-4800   °[facility]/Dr. Osei-Bonsu ° [address] [place] [address], [place] or [address], [place] (336) 841-8500 Phone number for both [place] and [place] locations is the same.  °[facility] [address], [place] (336) 299-0000   °[facility] [address] [place] [address], [place] or [address] (336) 852-7530 °(336) 878-2260   °[facility] [address], [place] (336) 350-1642, phone; (336) 294-5005, fax Sees patients 1st and 3rd Saturday of every month.  Must not qualify for public or private insurance (i.e. Medicaid, Medicare, [place] Health Choice, Veterans' Benefits) • Household income should be no more than 200% of the poverty level •The clinic cannot treat you if you are pregnant or think you are pregnant • Sexually transmitted diseases are not treated at the clinic.  ° ° °Dental Care: °Organization         Address  Phone  Notes  °[place] [facility] [address], [place] (336) 641-6152 Accepts children up to age 21 who are enrolled in Medicaid or [place] Health Choice; pregnant women with a Medicaid card; and children who have applied for Medicaid or [place] Health Choice, but were declined, whose parents can pay a reduced fee at time of service.  °[place]  [facility] [place]  [address], [place] (336) 641-7733 Accepts children up to age 21 who are enrolled in Medicaid or [place] Health Choice; pregnant women with a Medicaid card; and children who have applied for Medicaid or [place] Health Choice, but were declined, whose parents can pay a reduced fee at time of service.  °[facility] ° [address], [place] (336) 641-4533 Patients are seen by appointment only. Walk-ins are not accepted. [facility] will see patients 18 years of age and older. °Monday - Tuesday (8am-5pm) °Most Wednesdays (8:30-5pm) °$30 per visit, cash only  °[facility] ° [address], [place] (336) 641-4533 Patients are seen by appointment only. Walk-ins are not accepted. [facility] will see patients 18 years of age and older. °One   Wednesday Evening (Monthly: Volunteer Based).  $30 per visit, cash only  °[facility]  (919) 537-3737 for adults; Children under age 4, call Graduate Pediatric Dentistry at (919) [phone]. Children aged 4-14, please call (919) [phone] to request a pediatric application. ° Dental services are provided in all areas of dental care including fillings, crowns and bridges, complete and partial dentures, implants, gum treatment, root canals, and extractions. Preventive care is also provided. Treatment is provided to both adults and children. °Patients are selected via a lottery and there is often a waiting list. °  °[facility] [address], °[place] ° (336) 763-8833 [email or web address] °  °[facility] [address], [place], [place] (336)723-1848, Ext. 123 Second and Fourth Thursday of each month, opens at 6:30 AM; Clinic ends at 9 AM.  Patients are seen on a first-come first-served basis, and a limited number are seen during each clinic.  ° °[facility] ° [address], [place], [place] (336) 723-7904    Eligibility Requirements °You must have lived in [place], [place], or [place] counties for at least the last three months. °  You cannot be eligible for state or federal sponsored healthcare insurance, including Veterans Administration, Medicaid, or Medicare. °  You generally cannot be eligible for healthcare insurance through your employer.  °  How to apply: °Eligibility screenings are held every Tuesday and Wednesday afternoon from 1:00 pm until 4:00 pm. You do not need an appointment for the interview!  °[facility] [address], [place], [place] 336-631-2330   °[facility]  336-342-8273   °[facility]  336-703-3100   °[facility]  336-570-6415   ° °Behavioral Health Resources in the Community: °Intensive Outpatient Programs °Organization         Address  Phone  Notes  °[facility] [address], [place], [place] 336-878-6098   °[facility] [address], [place], [place] 336-832-9800   °[facility] [address], [place], [place] ° 336-882-2125   °[facility] [address],  °[place], [place] 1-800-853-5163 or 336-641-4981   °Substance Abuse Resources °Organization         Address  Phone  Notes  °[facility]  336-882-2125   °[facility]  336-784-9470   °[facility]  336-285-9073   °[facility]  336-845-3988   °Residential & Outpatient Substance Abuse Program  1-800-659-3381   °Psychological Services °Organization         Address  Phone  Notes  °[facility]  336- 832-9600   °[facility]  336- 378-7881   °[facility] [address], [place] 1-800-853-5163 or 336-641-4981   ° °Mobile Crisis Teams °Organization         Address  Phone  Notes  °[facility], Mobile Crisis Care Unit  1-877-626-1772   °Assertive °[facility] ° [address]  [place], [place] 336-834-9664   °Sharon DeEsch [address] °[place] 336-554-5454   ° °Self-Help/Support Groups °Organization         Address  Phone             Notes  °[facility] - variety of support groups  336- 373-1402 Call for more information  °Narcotics Anonymous (NA), [facility] [address], °[place]  2 meetings at this location  ° °  Residential Treatment Programs °Organization         Address  Phone  Notes  °[facility] [address],    °[place]  1-866-801-8205   °[facility] ° [address], [place], [place] 704-293-8524   °[facility] [address], [place] 336-845-3988 Admissions: 8am-3pm M-F  °[facility] [address]    °[place], [place] 336-841-1104   °[facility] [address], [place], [place] 336-379-7146   °[facility] [address]  °[place], [place] 336-285-9073   °[facility] - Intensive Outpatient [address], [place], [place] 336-852-3033   °[facility] [address]  °[place], [place] 1-877-615-2722 or 336-784-9470   °[facility] [address] [place], [place] 336-227-7417 Accepts Medicaid  °[facility] [address]  °[place] 1-800-659-3381 Substance Abuse/Addiction Treatment  ° °[place] Behavioral Health Resources °Organization         Address  Phone  Notes  °[facility]  (888) 581-9988   °Julie Brannon, PhD [address] [place], [place]   (336) 349-5553 or (336) 951-0000   °[facility]   [address] °[place], [place] (336) 349-4454   °[facility] [address], [place], [place] (336) 342-8316 Insurance/Medicaid/sponsorship through [facility]  °[facility] [address]                                    [place], [place] (336) 342-8316 Therapy/tele-psych/case    °[facility] [address]  ° [place], [place] (336) 349-2233    °Dr. Arfeen  (336) 349-4544   °[facility] [place]  [employer] [place] [facility] 1) [address],  °2) [address], [place] °3)  [address], [place] (336) 349-3220 °(336) 342-7768 ° °(336) 342-8140   °[place] [facility] (336) 342-1394 or (336) 342-3537 (After Hours)    ° ° °

## 2014-07-23 ENCOUNTER — Emergency Department (HOSPITAL_COMMUNITY)
Admission: EM | Admit: 2014-07-23 | Discharge: 2014-07-24 | Disposition: A | Discharge status: Home / Self Care | Payer: Self-pay | Attending: Emergency Medicine | Admitting: Emergency Medicine

## 2014-07-23 ENCOUNTER — Emergency Department (HOSPITAL_COMMUNITY): Payer: Self-pay

## 2014-07-23 ENCOUNTER — Encounter (HOSPITAL_COMMUNITY): Payer: Self-pay | Admitting: *Deleted

## 2014-07-23 DIAGNOSIS — F1210 Cannabis abuse, uncomplicated: Secondary | ICD-10-CM | POA: Insufficient documentation

## 2014-07-23 DIAGNOSIS — R079 Chest pain, unspecified: Secondary | ICD-10-CM | POA: Insufficient documentation

## 2014-07-23 DIAGNOSIS — Z79899 Other long term (current) drug therapy: Secondary | ICD-10-CM | POA: Insufficient documentation

## 2014-07-23 DIAGNOSIS — F1510 Other stimulant abuse, uncomplicated: Secondary | ICD-10-CM | POA: Insufficient documentation

## 2014-07-23 DIAGNOSIS — Z8619 Personal history of other infectious and parasitic diseases: Secondary | ICD-10-CM | POA: Insufficient documentation

## 2014-07-23 DIAGNOSIS — Z720 Tobacco use: Secondary | ICD-10-CM | POA: Insufficient documentation

## 2014-07-23 DIAGNOSIS — F419 Anxiety disorder, unspecified: Secondary | ICD-10-CM | POA: Insufficient documentation

## 2014-07-23 DIAGNOSIS — F1110 Opioid abuse, uncomplicated: Secondary | ICD-10-CM | POA: Insufficient documentation

## 2014-07-23 DIAGNOSIS — Z880 Allergy status to penicillin: Secondary | ICD-10-CM | POA: Insufficient documentation

## 2014-07-23 HISTORY — DX: Bipolar disorder, unspecified: F31.9

## 2014-07-23 LAB — CBC
HCT: 44.5 % (ref 39.0–52.0)
Hemoglobin: 15.2 g/dL (ref 13.0–17.0)
MCH: 31.2 pg (ref 26.0–34.0)
MCHC: 34.2 g/dL (ref 30.0–36.0)
MCV: 91.4 fL (ref 78.0–100.0)
PLATELETS: 232 10*3/uL (ref 150–400)
RBC: 4.87 MIL/uL (ref 4.22–5.81)
RDW: 13.5 % (ref 11.5–15.5)
WBC: 10.3 10*3/uL (ref 4.0–10.5)

## 2014-07-23 NOTE — ED Notes (Signed)
Pt presents to ED requesting detox from heroin and methamphetamines - has been through detox in the past however believes he needs a residential detox center admission. Pt last used heroin approx 1hr pta and methamphetamines yesterday - pt denies SI/HI.

## 2014-07-23 NOTE — ED Notes (Addendum)
Pt also c/o dull aching pain to rt chest and admits to hx of pneumothorax approx 1 yr ago. Pain has reoccurred and began approx August of 2015

## 2014-07-24 LAB — COMPREHENSIVE METABOLIC PANEL
ALT: 146 U/L — AB (ref 0–53)
AST: 81 U/L — AB (ref 0–37)
Albumin: 3.8 g/dL (ref 3.5–5.2)
Alkaline Phosphatase: 115 U/L (ref 39–117)
Anion gap: 7 (ref 5–15)
BILIRUBIN TOTAL: 0.2 mg/dL — AB (ref 0.3–1.2)
BUN: 17 mg/dL (ref 6–23)
CHLORIDE: 104 mmol/L (ref 96–112)
CO2: 27 mmol/L (ref 19–32)
Calcium: 8.7 mg/dL (ref 8.4–10.5)
Creatinine, Ser: 1.04 mg/dL (ref 0.50–1.35)
GFR calc Af Amer: >90 mL/min (ref >90)
GFR calc non Af Amer: >90 mL/min (ref >90)
GLUCOSE: 122 mg/dL — AB (ref 70–99)
POTASSIUM: 4.1 mmol/L (ref 3.5–5.1)
Sodium: 138 mmol/L (ref 135–145)
Total Protein: 7 g/dL (ref 6.0–8.3)

## 2014-07-24 LAB — RAPID URINE DRUG SCREEN, HOSP PERFORMED
AMPHETAMINES: POSITIVE — AB
BARBITURATES: NOT DETECTED
Benzodiazepines: NOT DETECTED
COCAINE: NOT DETECTED
OPIATES: POSITIVE — AB
TETRAHYDROCANNABINOL: POSITIVE — AB

## 2014-07-24 LAB — ETHANOL: ALCOHOL ETHYL (B): 11 mg/dL — AB (ref 0–9)

## 2014-07-24 LAB — SALICYLATE LEVEL

## 2014-07-24 LAB — ACETAMINOPHEN LEVEL: Acetaminophen (Tylenol), Serum: <10 ug/mL — ABNORMAL LOW (ref 10–30)

## 2014-07-24 NOTE — ED Notes (Signed)
Pt refused to sign for discharge papers and d/v VS. Pt agitated he is not being directly admitted for heroin/meth detox.

## 2014-07-24 NOTE — ED Provider Notes (Signed)
CSN: [id number]     Arrival date & time 07/23/14  2153 History   First MD Initiated Contact with Patient 07/24/14 0055     Chief Complaint  Patient presents with  . Addiction Problem   (Consider location/radiation/quality/duration/timing/severity/associated sxs/prior Treatment) HPI Martin Allen is a 34 year old male presenting with request for detox. He states he is currently addicted to both methamphetamines and heroin and is requesting inpatient detox from both. He states he smokes a pack of cigarettes a day, he drinks 1-2 24 ounce beers per week and uses 1 gram to 2 g of methamphetamines and heroin on a daily basis. He denies any catalyst that caused him to want detox today. He reports chest pain that he's had for more than a year and is no worse or different than normal for him. He denies any thoughts of suicidal ideation or homicidal ideation.   Past Medical History  Diagnosis Date  . Hepatitis C virus   . Heroin addiction   . Mental disorder   . Depression   . Bipolar disorder (manic depression)    History reviewed. No pertinent past surgical history. Family History  Problem Relation Age of Onset  . Hypertension Other    History  Substance Use Topics  . Smoking status: Current Every Day Smoker -- 1.00 packs/day    Types: Cigarettes  . Smokeless tobacco: Not on file  . Alcohol Use: 7.2 oz/week    12 Cans of beer per week    Review of Systems  Constitutional: Negative for fever and chills.  HENT: Negative for sore throat.   Eyes: Negative for visual disturbance.  Respiratory: Negative for cough and shortness of breath.   Cardiovascular: Positive for chest pain ( Ongoing x 1 year). Negative for leg swelling.  Gastrointestinal: Negative for nausea, vomiting and diarrhea.  Genitourinary: Negative for dysuria.  Musculoskeletal: Negative for myalgias.  Skin: Negative for rash.  Neurological: Negative for weakness, numbness and headaches.  Psychiatric/Behavioral: Negative  for suicidal ideas. The patient is nervous/anxious.       Allergies  Ceclor and Penicillins  Home Medications   Prior to Admission medications   Medication Sig Start Date End Date Taking? Authorizing Provider  ibuprofen (ADVIL,MOTRIN) 200 MG tablet Take 200 mg by mouth every 6 (six) hours as needed.   Yes Historical Provider, MD  omega-3 acid ethyl esters (LOVAZA) 1 G capsule Take 1 g by mouth daily.   Yes Historical Provider, MD  vitamin B-12 (CYANOCOBALAMIN) 1000 MCG tablet Take 1,000 mcg by mouth daily.   Yes Historical Provider, MD   BP 127/82 mmHg  Pulse 92  Temp(Src) 97.8 F (36.6 C) (Oral)  Resp 18  SpO2 98% Physical Exam  Constitutional: He appears well-developed and well-nourished. No distress.  HENT:  Head: Normocephalic and atraumatic.  Mouth/Throat: Oropharynx is clear and moist. No oropharyngeal exudate.  Eyes: Conjunctivae are normal.  Neck: Neck supple. No thyromegaly present.  Cardiovascular: Normal rate, regular rhythm and intact distal pulses.   Pulmonary/Chest: Effort normal and breath sounds normal. No respiratory distress. He has no wheezes. He has no rales. He exhibits no tenderness.  Abdominal: Soft. There is no tenderness.  Musculoskeletal: He exhibits no tenderness.  Lymphadenopathy:    He has no cervical adenopathy.  Neurological: He is alert.  Skin: Skin is warm and dry. No rash noted. He is not diaphoretic.  Psychiatric: His mood appears anxious. He is agitated. He expresses no homicidal and no suicidal ideation.  Nursing note and vitals reviewed.  ED Course  Procedures (including critical care time) Labs Review Labs Reviewed  ACETAMINOPHEN LEVEL - Abnormal; Notable for the following:    Acetaminophen (Tylenol), Serum <10.0 (*)    All other components within normal limits  COMPREHENSIVE METABOLIC PANEL - Abnormal; Notable for the following:    Glucose, Bld 122 (*)    AST 81 (*)    ALT 146 (*)    Total Bilirubin 0.2 (*)    All other  components within normal limits  ETHANOL - Abnormal; Notable for the following:    Alcohol, Ethyl (B) 11 (*)    All other components within normal limits  CBC  SALICYLATE LEVEL  URINE RAPID DRUG SCREEN (HOSP PERFORMED)    Imaging Review Dg Chest 2 View  07/24/2014   CLINICAL DATA:  Initial evaluation for acute central chest pain for 1 day. History of prior pneumothorax.  EXAM: CHEST  2 VIEW  COMPARISON:  Prior radiograph from 11/25/2013  FINDINGS: The cardiac and mediastinal silhouettes are stable in size and contour, and remain within normal limits.  The lungs are normally inflated. No airspace consolidation, pleural effusion, or pulmonary edema is identified. There is no pneumothorax.  No acute osseous abnormality identified.  IMPRESSION: No active cardiopulmonary disease.   Electronically Signed   By: Rise Mu M.D.   On: 07/24/2014 00:15     EKG Interpretation   Date/Time:  Tuesday July 23 2014 22:49:52 EST Ventricular Rate:  80 PR Interval:  153 QRS Duration: 110 QT Interval:  370 QTC Calculation: 427 R Axis:   7 Text Interpretation:  Sinus rhythm RSR' in V1 or V2, probably normal  variant Confirmed by Lincoln Brigham [phone]) on 07/23/2014 10:53:32 PM      MDM   Final diagnoses:  Opioid abuse  Amphetamine abuse   34 yo male requesting detox from opioids and methamphetamines. Pt reports only rare benzodiazepine use and no significant etoh intake. He has no new or concerning complaints.  He denies SI/HI. His labs, CXR and EKG are negative for significant abnormality. Discussed with pt outpt resources for detox.  Pt is disappointed he is not being admitted here for inpt detox.  Discussed pt does not require medical monitoring in the ED for his withdrawal symptoms. Discussed providing prescriptions for meds to help with withdrawal symptoms and pt declined all prescriptions. Pt is well-appearing, in no acute distress and vital signs reviewed are not concerning. He appears safe  to be discharged.  Return precautions provided.      Filed Vitals:   07/23/14 2239  BP: 127/82  Pulse: 92  Temp: 97.8 F (36.6 C)  TempSrc: Oral  Resp: 18  SpO2: 98%   Meds given in ED:  Medications - No data to display  Discharge Medication List as of 07/24/2014  1:31 AM        Harle Battiest, NP 07/25/14 6045  Loren Racer, MD 07/31/14 [phone]

## 2014-07-24 NOTE — Discharge Instructions (Signed)
Please follow the directions provided. Be sure to use the resource guide to follow up with the detox services needed.   SEEK MEDICAL CARE IF:  You are not able to take your medicines as directed.  Your symptoms get worse.  You relapse.    Emergency Department Resource Guide 1) Find a Doctor and Pay Out of Pocket Although you won't have to find out who is covered by your insurance plan, it is a good idea to ask around and get recommendations. You will then need to call the office and see if the doctor you have chosen will accept you as a new patient and what types of options they offer for patients who are self-pay. Some doctors offer discounts or will set up payment plans for their patients who do not have insurance, but you will need to ask so you aren't surprised when you get to your appointment.  2) Contact Your Local Health Department Not all health departments have doctors that can see patients for sick visits, but many do, so it is worth a call to see if yours does. If you don't know where your local health department is, you can check in your phone book. The CDC also has a tool to help you locate your state's health department, and many state websites also have listings of all of their local health departments.  3) Find a Walk-in Clinic If your illness is not likely to be very severe or complicated, you may want to try a walk in clinic. These are popping up all over the country in pharmacies, drugstores, and shopping centers. They're usually staffed by nurse practitioners or physician assistants that have been trained to treat common illnesses and complaints. They're usually fairly quick and inexpensive. However, if you have serious medical issues or chronic medical problems, these are probably not your best option.  No Primary Care Doctor: - Call Health Connect at  [phone] - they can help you locate a primary care doctor that  accepts your insurance, provides certain services,  etc. - Physician Referral Service- [phone]  Chronic Pain Problems: Organization         Address  Phone   Notes  [facility]  [phone] Patients need to be referred by their primary care doctor.   Medication Assistance: Organization         Address  Phone   Notes  [facility] [address] [place], [place] [address] [phone] --Must be a resident of [place] -- Must have NO insurance coverage whatsoever (no Medicaid/ Medicare, etc.) -- The pt. MUST have a primary care doctor that directs their care regularly and follows them in the community   MedAssist  [phone]   [employer]  [phone]    Agencies that provide inexpensive medical care: Organization         Address  Phone   Notes  [facility]  [phone]   [facility]    [phone]   [facility] [address] [place], [place] [address] [phone]   [facility] [address], [place] [phone]   Planned Parenthood    [phone]   [facility]    [phone]   [facility] and [facility]  [address], [place] Phone:  [phone], Fax:  [phone] Hours of Operation:  9 am - 6 pm,  M-F.  Also accepts Medicaid/Medicare and self-pay.  [facility] for [address], [address], [place] Phone: [phone], Fax: [phone]. Hours of Operation:  8:30 am - 5:30 pm, M-F.  Also accepts Medicaid and self-pay.  [facility] [place] [address], [place] Phone: [phone]   [place], [place], [place] [phone], Ext. [phone] Mondays & Thursdays: 7-9 AM.  First 15 patients are seen on a first come, first serve basis.    [place] Providers:  Organization         Address  Phone   Notes  [facility] [address], [place] [phone] Also accepts self-pay patients.  [facility] [address], [place]  [phone]   [place], [address], [place] [phone]   [facility] Family Medicine [address], [place] [phone]   Lucianne Lei [address], [place]   [phone] Only accepts [place] Access [place] patients after they have their name applied to their card.   Self-Pay (no insurance) in [facility]:  Organization         Address  Phone   Notes  Sickle Cell Patients, [facility] [place] [phone]   [facility] [place] [phone]   [facility] [place]  [place], [place], [place] [phone]   [facility]/Dr. Osei-Bonsu  [address] [place] or [address], [place] [phone] Phone number for both [place] and [place] locations is the same.  Urgent Medical and [facility] [address] [place] [phone]   [facility] [address] [place] or [address] [phone]   [facility] [address] [place] [phone], phone; [phone], fax Sees patients 1st and 3rd Saturday of every month.  Must not qualify for public or private insurance (i.e. Medicaid, Medicare, [place] Health Choice, Veterans' Benefits)  Household income should be no more than 200% of the poverty level The clinic cannot treat you if you are pregnant or think you are pregnant  Sexually transmitted diseases are not treated at the clinic.    Dental Care: Organization         Address  Phone  Notes  [facility] Department of [facility] [place] [phone] Accepts children up to  age 82 who are enrolled in [place] or [place]; pregnant women with a Medicaid card; and children who have applied for Medicaid or [place] Health Choice, but were declined, whose parents can pay a reduced fee at time of service.  [facility] Department of [facility]  [address], [place] [phone] Accepts children up to age 43 who are enrolled in [place] or [place]; pregnant women with a Medicaid card; and children who have applied for Medicaid or [place] Health Choice, but were declined, whose parents can pay a reduced fee at time of service.  [place] Adult [facility] Access PROGRAM  [place] [phone] Patients are seen by appointment only. Walk-ins are not accepted. [place] will see patients 29 years of age and older. Monday -  Tuesday (8am-5pm) Most Wednesdays (8:30-5pm) $30 per visit, cash only  [facility] Adult Dental Access PROGRAM  [address], [facility] [phone] Patients are seen by appointment only. Walk-ins are not accepted. [place] will see patients 36 years of age and older. One Wednesday Evening (Monthly: Volunteer Based).  $30 per visit, cash only  [place]  [phone] for adults; Children under age 53, call Graduate Pediatric Dentistry at [phone]. Children aged 50-14, please call [phone] to request a pediatric application.  Dental services are provided in all areas of dental care including fillings, crowns and bridges, complete and partial dentures, implants, gum treatment, root canals, and extractions. Preventive care is also provided. Treatment is provided to both adults and children. Patients are selected via a lottery and there is often a waiting list.   [facility] [address] [place]  [phone] [email or web address]   [facility] [address] [place], [place] [phone], Ext. [phone] Second and Fourth Thursday of  each month, opens at 6:30 AM; Clinic ends at 9 AM.  Patients are seen on a first-come first-served basis, and a limited number are seen during each clinic.   [facility]  [address] Hillard Danker [place], [place] [phone]   Eligibility Requirements You must have lived in [place], [place], or [place] counties for at least the last three months.   You cannot be eligible for state or federal sponsored [employer], including [employer], [place], or [employer].   You generally cannot be eligible for healthcare insurance through your employer.    How to apply: Eligibility screenings are held every Tuesday and Wednesday afternoon from 1:00 pm until 4:00 pm. You do not need an appointment for the interview!  [facility] [address] [place], [place]   Nitro  [place] Department  [place]  [phone]    Behavioral Health Resources in the Community: Intensive Outpatient Programs Organization         Address  Phone  Notes  [place] Tuckerman. [address] [place], [place] [phone]   [facility] Outpatient [address], [place], [place]   ADS: Alcohol & Drug Svcs [address] [place], [place]   [place] [address],  [place], [place] or [phone]   Substance Abuse Resources Organization         Address  Phone  Notes  Alcohol and Drug Services  [phone]   [place]  [phone]   [facility]   Chinita Pester  [phone]   Residential & Outpatient Substance Abuse Program  [phone]   Psychological Services Organization         Address  Phone  Notes  [facility] [place]  [place]  [phone]   [place] [address],  [place] or [phone]    Mobile Crisis Teams Organization         Address  Phone  Notes  [facility], Mobile Crisis Care Unit  [phone]   [facility]  [address] [place], [place]   [place] Levels [address], [place] [phone]    Self-Help/Support Groups Organization         Address  Phone  Notes  [facility] [place] - variety of support groups  [place] Call for more information  Narcotics Anonymous (NA), [facility] [address], [employer] [place]  2 meetings at this location   Special educational needs teacher         Address  Phone  Notes  [facility] Residential Treatment [place],    [place]  [phone]   [facility]  [address], [place] [phone], [place], [place]   [place], [place] [phone] Admissions: 8am-3pm M-F  Incentives Substance [place] [address],    [place], [place] [phone]   [facility] [address] [place], [place], [place]   [facility] [address]  [place], [place]   [facility] - Intensive Outpatient [address] Kristeen [address], [place], [place]   [facility] ([place].,  [place], [place] [phone] or [phone]   [facility] Services (RTS) [address] [place], [place] Accepts Medicaid  [facility] [place] [address],  [place] [phone] Substance Abuse/Addiction Treatment   [facility] Organization         Address  Phone  Notes  [facility]  [phone]   Domenic Schwab, PhD [address] Arlis Porta [place], [place]   [phone] or [phone]   [place], [place] [phone]     [facility] [address], [place], [place] [phone] Insurance/Medicaid/sponsorship through [facility] and Families [address], [address]                                    [place], [place] [phone] [place] [address], [place] [phone]    Dr. Adele Schilder  [phone]   [facility] Dept. 1) [address], [place] 2) [place] 3)  [address], [place] [phone]  [phone]   [place] [phone] or [phone] (After Hours)

## 2014-11-02 ENCOUNTER — Emergency Department (HOSPITAL_COMMUNITY)
Admission: EM | Admit: 2014-11-02 | Discharge: 2014-11-05 | Disposition: A | Discharge status: Home / Self Care | Payer: Self-pay | Attending: Emergency Medicine | Admitting: Emergency Medicine

## 2014-11-02 ENCOUNTER — Encounter (HOSPITAL_COMMUNITY): Payer: Self-pay | Admitting: Emergency Medicine

## 2014-11-02 DIAGNOSIS — Z720 Tobacco use: Secondary | ICD-10-CM | POA: Insufficient documentation

## 2014-11-02 DIAGNOSIS — Z880 Allergy status to penicillin: Secondary | ICD-10-CM | POA: Insufficient documentation

## 2014-11-02 DIAGNOSIS — Z791 Long term (current) use of non-steroidal anti-inflammatories (NSAID): Secondary | ICD-10-CM | POA: Insufficient documentation

## 2014-11-02 DIAGNOSIS — F1510 Other stimulant abuse, uncomplicated: Secondary | ICD-10-CM | POA: Insufficient documentation

## 2014-11-02 DIAGNOSIS — F1410 Cocaine abuse, uncomplicated: Secondary | ICD-10-CM | POA: Insufficient documentation

## 2014-11-02 DIAGNOSIS — R45851 Suicidal ideations: Secondary | ICD-10-CM

## 2014-11-02 DIAGNOSIS — F419 Anxiety disorder, unspecified: Secondary | ICD-10-CM | POA: Insufficient documentation

## 2014-11-02 DIAGNOSIS — F1210 Cannabis abuse, uncomplicated: Secondary | ICD-10-CM | POA: Insufficient documentation

## 2014-11-02 DIAGNOSIS — Z8619 Personal history of other infectious and parasitic diseases: Secondary | ICD-10-CM | POA: Insufficient documentation

## 2014-11-02 DIAGNOSIS — F1910 Other psychoactive substance abuse, uncomplicated: Secondary | ICD-10-CM | POA: Diagnosis present

## 2014-11-02 NOTE — ED Notes (Signed)
Pt requesting detox from MDMA, meth, cocaine, opiates, marijuana and etoh. Pt states he does not feel like can continue abusing drugs and is unable to stop

## 2014-11-03 DIAGNOSIS — Z590 Homelessness: Secondary | ICD-10-CM

## 2014-11-03 DIAGNOSIS — R45851 Suicidal ideations: Secondary | ICD-10-CM

## 2014-11-03 DIAGNOSIS — F1910 Other psychoactive substance abuse, uncomplicated: Secondary | ICD-10-CM

## 2014-11-03 LAB — CBC WITH DIFFERENTIAL/PLATELET
BASOS ABS: 0 10*3/uL (ref 0.0–0.1)
Basophils Relative: 0 % (ref 0–1)
EOS ABS: 0.3 10*3/uL (ref 0.0–0.7)
EOS PCT: 5 % (ref 0–5)
HEMATOCRIT: 42 % (ref 39.0–52.0)
Hemoglobin: 14.4 g/dL (ref 13.0–17.0)
LYMPHS ABS: 2.6 10*3/uL (ref 0.7–4.0)
Lymphocytes Relative: 39 % (ref 12–46)
MCH: 31.5 pg (ref 26.0–34.0)
MCHC: 34.3 g/dL (ref 30.0–36.0)
MCV: 91.9 fL (ref 78.0–100.0)
MONO ABS: 0.9 10*3/uL (ref 0.1–1.0)
Monocytes Relative: 13 % — ABNORMAL HIGH (ref 3–12)
Neutro Abs: 2.9 10*3/uL (ref 1.7–7.7)
Neutrophils Relative %: 43 % (ref 43–77)
PLATELETS: 197 10*3/uL (ref 150–400)
RBC: 4.57 MIL/uL (ref 4.22–5.81)
RDW: 12.9 % (ref 11.5–15.5)
WBC: 6.8 10*3/uL (ref 4.0–10.5)

## 2014-11-03 LAB — RAPID URINE DRUG SCREEN, HOSP PERFORMED
Amphetamines: POSITIVE — AB
BENZODIAZEPINES: NOT DETECTED
Barbiturates: NOT DETECTED
Cocaine: POSITIVE — AB
OPIATES: NOT DETECTED
Tetrahydrocannabinol: POSITIVE — AB

## 2014-11-03 LAB — COMPREHENSIVE METABOLIC PANEL
ALT: 172 U/L — ABNORMAL HIGH (ref 17–63)
AST: 102 U/L — ABNORMAL HIGH (ref 15–41)
Albumin: 3.9 g/dL (ref 3.5–5.0)
Alkaline Phosphatase: 106 U/L (ref 38–126)
Anion gap: 8 (ref 5–15)
BUN: 15 mg/dL (ref 6–20)
CALCIUM: 8.6 mg/dL — AB (ref 8.9–10.3)
CO2: 25 mmol/L (ref 22–32)
Chloride: 105 mmol/L (ref 101–111)
Creatinine, Ser: 0.92 mg/dL (ref 0.61–1.24)
GFR calc Af Amer: >60 mL/min (ref >60)
GFR calc non Af Amer: >60 mL/min (ref >60)
Glucose, Bld: 98 mg/dL (ref 65–99)
Potassium: 3.9 mmol/L (ref 3.5–5.1)
SODIUM: 138 mmol/L (ref 135–145)
Total Bilirubin: 0.8 mg/dL (ref 0.3–1.2)
Total Protein: 6.8 g/dL (ref 6.5–8.1)

## 2014-11-03 LAB — ETHANOL: Alcohol, Ethyl (B): <5 mg/dL (ref <5)

## 2014-11-03 MED ORDER — ACETAMINOPHEN 325 MG PO TABS: 650.0000 mg | ORAL_TABLET | ORAL | Status: DC | PRN

## 2014-11-03 MED ORDER — NICOTINE 21 MG/24HR TD PT24: 21.0000 mg | MEDICATED_PATCH | Freq: Every day | TRANSDERMAL | Status: DC

## 2014-11-03 MED ORDER — ONDANSETRON HCL 4 MG PO TABS: 4.0000 mg | ORAL_TABLET | Freq: Three times a day (TID) | ORAL | Status: DC | PRN

## 2014-11-03 MED ORDER — IBUPROFEN 200 MG PO TABS: 600.0000 mg | ORAL_TABLET | Freq: Three times a day (TID) | ORAL | Status: DC | PRN
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
## 2014-11-03 NOTE — ED Notes (Signed)
Pt sleeping most of the morning.  When awakened for MD rounds pt was very irritable and loud.  Stated "i need help getting off drugs or i am going to kill myself"  Pt was very jittery and anxious.  He stated he has money saved to get enough heroin to kill himself.

## 2014-11-03 NOTE — ED Provider Notes (Signed)
CSN: [id number]     Arrival date & time 11/02/14  2308 History   First MD Initiated Contact with Patient 11/03/14 0032     Chief Complaint  Patient presents with  . Req detox      (Consider location/radiation/quality/duration/timing/severity/associated sxs/prior Treatment) HPI Comments: 34 year old male presents to the emergency department requesting detox. Patient reports that he has used MDMA, methamphetamines, cocaine, opiates, and marijuana in the last 48 hours. He has been using these drugs consistently over the past many months. He reports worsening of his polysubstance abuse over the last month. He states he was sober for a period of 5 months in 2009. Patient feels like he cannot stop abusing drugs. He states that he has had suicidal ideations of walking in front of a bus to kill himself. Patient has been using alcohol, but inconsistently. He has no history of seizures from alcohol withdrawal. No homicidal ideations 34 year old male presents to the emergency department requesting detox and placement for his polysubstance abuse. Labs reviewed and patient medically cleared. TTS was consult it given patient's suicidal does not take an  The history is provided by the patient. No language interpreter was used.    Past Medical History  Diagnosis Date  . Hepatitis C virus   . Heroin addiction   . Mental disorder   . Depression   . Bipolar disorder (manic depression)    History reviewed. No pertinent past surgical history. Family History  Problem Relation Age of Onset  . Hypertension Other    History  Substance Use Topics  . Smoking status: Current Every Day Smoker -- 1.00 packs/day    Types: Cigarettes  . Smokeless tobacco: Not on file  . Alcohol Use: 7.2 oz/week    12 Cans of beer per week    Review of Systems  Psychiatric/Behavioral: Positive for suicidal ideas and behavioral problems.  All other systems reviewed and are negative.   Allergies  Ceclor and  Penicillins  Home Medications   Prior to Admission medications   Medication Sig Start Date End Date Taking? Authorizing Provider  ibuprofen (ADVIL,MOTRIN) 200 MG tablet Take 200 mg by mouth every 6 (six) hours as needed.   Yes Historical Provider, MD  naproxen sodium (ANAPROX) 220 MG tablet Take 440 mg by mouth 2 (two) times daily as needed (pain).   Yes Historical Provider, MD   BP 139/97 mmHg  Pulse 65  Temp(Src) 98.4 F (36.9 C) (Oral)  Resp 18  Ht  (1.854 m)  Wt 175 lb (79.379 kg)  BMI 23.09 kg/m2  SpO2 97%   Physical Exam  Constitutional: He is oriented to person, place, and time. He appears well-developed and well-nourished. No distress.  HENT:  Head: Normocephalic and atraumatic.  Eyes: Conjunctivae and EOM are normal. No scleral icterus.  Neck: Normal range of motion.  Cardiovascular: Normal rate, regular rhythm and intact distal pulses.   Pulmonary/Chest: Effort normal. No respiratory distress.  Musculoskeletal: Normal range of motion.  Neurological: He is alert and oriented to person, place, and time. He exhibits normal muscle tone. Coordination normal.  Skin: Skin is warm and dry. No rash noted. He is not diaphoretic. No erythema. No pallor.  Psychiatric: His mood appears anxious. His speech is rapid and/or pressured. He is agitated and hyperactive. He expresses suicidal ideation. He expresses no homicidal ideation. He expresses suicidal plans. He expresses no homicidal plans.  Nursing note and vitals reviewed.   ED Course  Procedures (including critical care time) Labs Review Labs Reviewed  CBC WITH DIFFERENTIAL/PLATELET - Abnormal; Notable for the following:    Monocytes Relative 13 (*)    All other components within normal limits  COMPREHENSIVE METABOLIC PANEL - Abnormal; Notable for the following:    Calcium 8.6 (*)    AST 102 (*)    ALT 172 (*)    All other components within normal limits  ETHANOL  URINE RAPID DRUG SCREEN, HOSP PERFORMED     Imaging Review No results found.   EKG Interpretation None      MDM   Final diagnoses:  Polysubstance abuse  Passive suicidal ideations    34 year old male presents to the emergency department requesting placement for his polysubstance abuse. Labs reviewed and patient medically cleared. TTS was consult it given patient's suicidality. TTS is currently seeking placement for the patient at this time. Disposition to be determined by oncoming ED provider. Temp psych hold orders placed.   Filed Vitals:   11/02/14 2328  BP: 139/97  Pulse: 65  Temp: 98.4 F (36.9 C)  TempSrc: Oral  Resp: 18  Height: 6\' 1"  (1.854 m)  Weight: 175 lb (79.379 kg)  SpO2: 97%       Antony Madura, PA-C 11/03/14 0175  Loren Racer, MD 11/03/14 [phone]

## 2014-11-03 NOTE — ED Notes (Signed)
Bed: [facility] Expected date:  Expected time:  Means of arrival:  Comments: t3

## 2014-11-03 NOTE — ED Notes (Signed)
Pt's changed into paper scrubs and wanded by security.

## 2014-11-03 NOTE — BH Assessment (Signed)
Reviewed ED notes prior to initiating assessment. Per notes pt requesting assistance to detox from MDMA, meth, cocaine, opiates, THC, and etoh.   Assessment to commence shortly.    Clista Bernhardt, [facility] Triage Specialist 11/03/2014 1:03 AM

## 2014-11-03 NOTE — BH Assessment (Signed)
Completed assessment, and consulted Alberteen Sam, NP. Per Alberteen Sam, NP pt meets inpt criteira and can be accepted to 300 hall bed pending availability. Per Rutha Bouchard there are currently no male 300 hall beds available. TTS to seek placement at dual dx facility.    Clista Bernhardt, [facility] Triage Specialist 11/03/2014 1:46 AM

## 2014-11-03 NOTE — ED Notes (Signed)
Patient appears flat. Denies SI, HI, AVH at present. Rates anxiety 5/10, depression 7/10. Contracts for safety on the unit.   Encouragement offered.  Q 15 safety checks continue.

## 2014-11-03 NOTE — Consult Note (Signed)
[facility] Consult   Reason for Consult:  Polysubstance abuse Referring Physician:  EDP Patient Identification: Martin Allen MRN:  [id number] Principal Diagnosis: <principal problem not specified> Diagnosis:   Patient Active Problem List   Diagnosis Date Noted  . Pneumothorax [J93.9] 08/23/2013  . Polysubstance abuse [F19.10] 08/23/2013  . Hepatitis C [B19.20] 08/23/2013  . Leucocytosis [D72.829] 08/23/2013    Total Time spent with patient: 30 minutes  Subjective:   Martin Allen is a 34 y.o. male patient admitted with polysubstance abuse.  HPI:  This patient is a 34 year old white male who states that he is homeless. He has a long-standing history of polysubstance abuse dating back at least 15 years. 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 He also uses opiates, marijuana alcohol and Xanax.  The patient stated that he is desperate to get off drugs now. He's been in various treatment centers and claims his most recent treatment was at [facility]. He states that as soon as he gets done he starts using again and needs "long-term treatment." He states that at this point he is unable to function. He does not brush his teeth are take care of himself and is never any would live. He claims that he will kill himself if he has to go back into the same situation. He denies auditory or visual hallucinations or thoughts of hurting other people. He is extremely anxious and was irritable and angry today HPI Elements:   Location:  Global. Quality:  Worsening. Severity:  Severe. Timing:  Past 2 weeks. Duration:  15 years. Context:  Inability to stop using addictive drugs.  Past Medical History:  Past Medical History  Diagnosis Date  . Hepatitis C virus   . Heroin addiction   . Mental disorder   . Depression   . Bipolar disorder (manic depression)    History reviewed. No pertinent past surgical history. Family History:  Family History   Problem Relation Age of Onset  . Hypertension Other    Social History:  History  Alcohol Use  . 7.2 oz/week  . 12 Cans of beer per week     History  Drug Use  . Yes  . Special: Marijuana, Heroin, Cocaine, Methamphetamines, IV    Comment: heroin    History   Social History  . Marital Status: Single    Spouse Name: N/A  . Number of Children: N/A  . Years of Education: N/A   Social History Main Topics  . Smoking status: Current Every Day Smoker -- 1.00 packs/day    Types: Cigarettes  . Smokeless tobacco: Not on file  . Alcohol Use: 7.2 oz/week    12 Cans of beer per week  . Drug Use: Yes    Special: Marijuana, Heroin, Cocaine, Methamphetamines, IV     Comment: heroin  . Sexual Activity: Not on file   Other Topics Concern  . None   Social History Narrative   Additional Social History:    Pain Medications: abuses oxycodone Prescriptions: see PTA Over the Counter: see PTA History of alcohol / drug use?: Yes Longest period of sobriety (when/how long): days, no hx of seizure reported  Negative Consequences of Use: Personal relationships, Work / Youth worker Withdrawal Symptoms:  (none currently reported) Name of Substance 1: Meth 1 - Age of First Use: 20 tried, but began use about three years ago  1 - Amount (size/oz): 2 grams per day when using 1 - Frequency: reports uses for a  week or two then not for a week 1 - Duration: three years 1 - Last Use / Amount: 2 am 1/4 gram 11-01-14 Name of Substance 2: cocaine (powder) and crack 2 - Age of First Use: 16 one time use than began at 30 2 - Amount (size/oz): varies 2 - Frequency: rarely for powder cocaine, varies for crack 2 - Duration: on and off for years 2 - Last Use / Amount: 11-01-14 $100 Name of Substance 3: THC 3 - Age of First Use: 15 3 - Amount (size/oz): pt unsure reports amount has decreased as meth use has increased 3 - Frequency: daily  3 - Duration: years 3 - Last Use / Amount: 11-01-14 Name of Substance 4:  MDMA-ectasy 4 - Age of First Use: [age] and then again recently  4 - Amount (size/oz): unknown 4 - Frequency: constantly for the last month  4 - Duration: on and off  4 - Last Use / Amount: uncertain  Name of Substance 5: etoh  5 - Age of First Use: 16 5 - Amount (size/oz): a couple of 85s 5 - Frequency: 1-2 times per week  5 - Duration: years 5 - Last Use / Amount: uncertain  Name of Substance 6: heroin 6 - Age of First Use: 23 6 - Amount (size/oz): varies 6 - Frequency: reports was using more frequently in the past not sure of current use, attempted to get on 11-01-14 6 - Duration: years 6 - Last Use / Amount: "a few months ago" Name of Substance 8: opioid 8 - Age of First Use: unknown 8 - Amount (size/oz): 30 -100 mg 8 - Frequency: almost daily  8 - Duration: increase in use recently  8 - Last Use / Amount: unknown        Allergies:   Allergies  Allergen Reactions  . Ceclor [Cefaclor] Itching and Rash  . Penicillins Itching and Rash    Labs:  Results for orders placed or performed during the hospital encounter of 11/02/14 (from the past 48 hour(s))  CBC with Differential     Status: Abnormal   Collection Time: 11/03/14 12:59 AM  Result Value Ref Range   WBC 6.8 4.0 - 10.5 K/uL   RBC 4.57 4.22 - 5.81 MIL/uL   Hemoglobin 14.4 13.0 - 17.0 g/dL   HCT 42.0 39.0 - 52.0 %   MCV 91.9 78.0 - 100.0 fL   MCH 31.5 26.0 - 34.0 pg   MCHC 34.3 30.0 - 36.0 g/dL   RDW 12.9 11.5 - 15.5 %   Platelets 197 150 - 400 K/uL   Neutrophils Relative % 43 43 - 77 %   Neutro Abs 2.9 1.7 - 7.7 K/uL   Lymphocytes Relative 39 12 - 46 %   Lymphs Abs 2.6 0.7 - 4.0 K/uL   Monocytes Relative 13 (H) 3 - 12 %   Monocytes Absolute 0.9 0.1 - 1.0 K/uL   Eosinophils Relative 5 0 - 5 %   Eosinophils Absolute 0.3 0.0 - 0.7 K/uL   Basophils Relative 0 0 - 1 %   Basophils Absolute 0.0 0.0 - 0.1 K/uL  Comprehensive metabolic panel     Status: Abnormal   Collection Time: 11/03/14 12:59 AM  Result Value  Ref Range   Sodium 138 135 - 145 mmol/L   Potassium 3.9 3.5 - 5.1 mmol/L   Chloride 105 101 - 111 mmol/L   CO2 25 22 - 32 mmol/L   Glucose, Bld 98 65 - 99 mg/dL  BUN 15 6 - 20 mg/dL   Creatinine, Ser 0.92 0.61 - 1.24 mg/dL   Calcium 8.6 (L) 8.9 - 10.3 mg/dL   Total Protein 6.8 6.5 - 8.1 g/dL   Albumin 3.9 3.5 - 5.0 g/dL   AST 102 (H) 15 - 41 U/L   ALT 172 (H) 17 - 63 U/L   Alkaline Phosphatase 106 38 - 126 U/L   Total Bilirubin 0.8 0.3 - 1.2 mg/dL   GFR calc non Af Amer >60 >60 mL/min   GFR calc Af Amer >60 >60 mL/min    Comment: (NOTE) The eGFR has been calculated using the CKD EPI equation. This calculation has not been validated in all clinical situations. eGFR's persistently <60 mL/min signify possible Chronic Kidney Disease.    Anion gap 8 5 - 15  Ethanol     Status: None   Collection Time: 11/03/14 12:59 AM  Result Value Ref Range   Alcohol, Ethyl (B) <5 <5 mg/dL    Comment:        LOWEST DETECTABLE LIMIT FOR SERUM ALCOHOL IS 5 mg/dL FOR MEDICAL PURPOSES ONLY   Urine rapid drug screen (hosp performed)     Status: Abnormal   Collection Time: 11/03/14  1:39 AM  Result Value Ref Range   Opiates NONE DETECTED NONE DETECTED   Cocaine POSITIVE (A) NONE DETECTED   Benzodiazepines NONE DETECTED NONE DETECTED   Amphetamines POSITIVE (A) NONE DETECTED   Tetrahydrocannabinol POSITIVE (A) NONE DETECTED   Barbiturates NONE DETECTED NONE DETECTED    Comment:        DRUG SCREEN FOR MEDICAL PURPOSES ONLY.  IF CONFIRMATION IS NEEDED FOR ANY PURPOSE, NOTIFY LAB WITHIN 5 DAYS.        LOWEST DETECTABLE LIMITS FOR URINE DRUG SCREEN Drug Class       Cutoff (ng/mL) Amphetamine      1000 Barbiturate      200 Benzodiazepine   696 Tricyclics       295 Opiates          300 Cocaine          300 THC              50     Vitals: Blood pressure 124/82, pulse 65, temperature 97.5 F (36.4 C), temperature source Oral, resp. rate 16, height $RemoveBe'6\' 1"'wftFHbarX$  (1.854 m), weight 79.379 kg (175  lb), SpO2 100 %.  Risk to Self: Suicidal Ideation: Yes-Currently Present Suicidal Intent: Yes-Currently Present Is patient at risk for suicide?: Yes Suicidal Plan?: Yes-Currently Present Specify Current Suicidal Plan: pt plans to overdose on heroin  Access to Means: Yes Specify Access to Suicidal Means: drugs What has been your use of drugs/alcohol within the last 12 months?: Pt has been abusing drugs since age 56 and currently abuses multiple subances, see narrative  How many times?: 0 Other Self Harm Risks: manufactures meth  Triggers for Past Attempts: None known Intentional Self Injurious Behavior: None Risk to Others: Homicidal Ideation: No Thoughts of Harm to Others: No Current Homicidal Intent: No Current Homicidal Plan: No Access to Homicidal Means: No Identified Victim: none History of harm to others?: No Assessment of Violence: None Noted Violent Behavior Description: none Does patient have access to weapons?: No Criminal Charges Pending?: No Does patient have a court date: No Prior Inpatient Therapy: Prior Inpatient Therapy: Yes Prior Therapy Dates: most recent two weeks ago, multiple Prior Therapy Facilty/Provider(s): [facility], [facility], [facility], [facility] Reason for Treatment: SA Prior Outpatient Therapy: Prior Outpatient Therapy:  No Prior Therapy Dates: NA Prior Therapy Facilty/Provider(s): NA Reason for Treatment: NA Does patient have an ACCT team?: No Does patient have Intensive In-House Services?  : No Does patient have [facility] services? : No Does patient have [facility] services?: No  Current Facility-Administered Medications  Medication Dose Route Frequency Provider Last Rate Last Dose  . acetaminophen (TYLENOL) tablet 650 mg  650 mg Oral Q4H PRN Antonietta Breach, PA-C      . ibuprofen (ADVIL,MOTRIN) tablet 600 mg  600 mg Oral Q8H PRN Antonietta Breach, PA-C      . nicotine (NICODERM CQ - dosed in mg/24 hours) patch 21 mg  21 mg Transdermal Daily Antonietta Breach, PA-C   21 mg at 11/03/14 1155   . ondansetron (ZOFRAN) tablet 4 mg  4 mg Oral Q8H PRN Antonietta Breach, PA-C       Current Outpatient Prescriptions  Medication Sig Dispense Refill  . ibuprofen (ADVIL,MOTRIN) 200 MG tablet Take 200 mg by mouth every 6 (six) hours as needed.    . naproxen sodium (ANAPROX) 220 MG tablet Take 440 mg by mouth 2 (two) times daily as needed (pain).      Musculoskeletal: Strength & Muscle Tone: within normal limits Gait & Station: normal Patient leans: N/A  Psychiatric Specialty Exam: Physical Exam  Review of Systems  Psychiatric/Behavioral: Positive for depression, suicidal ideas and substance abuse. The patient is nervous/anxious and has insomnia.   All other systems reviewed and are negative.   Blood pressure 124/82, pulse 65, temperature 97.5 F (36.4 C), temperature source Oral, resp. rate 16, height $RemoveBe'6\' 1"'fTmvuhDCl$  (1.854 m), weight 79.379 kg (175 lb), SpO2 100 %.Body mass index is 23.09 kg/(m^2).  General Appearance: Disheveled  Eye Contact::  Poor  Speech:  Pressured  Volume:  Normal  Mood:  Angry, Anxious and Depressed  Affect:  Depressed  Thought Process:  Coherent  Orientation:  Full (Time, Place, and Person)  Thought Content:  Rumination  Suicidal Thoughts:  Yes.  with intent/plan  Homicidal Thoughts:  No  Memory:  Immediate;   Fair Recent;   Fair Remote;   Fair  Judgement:  Poor  Insight:  Lacking  Psychomotor Activity:  Restlessness  Concentration:  Poor  Recall:  Poor  Fund of Knowledge:Fair  Language: Good  Akathisia:  No  Handed:  Right  AIMS (if indicated):     Assets:  Communication Skills Desire for Improvement Resilience  ADL's:  Impaired  Cognition: WNL  Sleep:      Medical Decision Making: Review of Psycho-Social Stressors (1), Review or order clinical lab tests (1), Review and summation of old records (2), Established Problem, Worsening (2), Review of Medication Regimen & Side Effects (2) and Review of New Medication or Change in Dosage (2)  Treatment Plan  Summary: Daily contact with patient to assess and evaluate symptoms and progress in treatment and Medication management  Plan:  Recommend psychiatric Inpatient admission when medically cleared. Disposition: Inpatient treatment for substance abuse and depression   Dakisha Schoof, [facility] 11/03/2014 12:11 PM

## 2014-11-03 NOTE — ED Notes (Signed)
Pt presents requesting detox from polysubstance abuse, SI with plan to OD on Heroin or step in front of bus.  Denies HI, reports feeling bugs on skin.  Feeling hopeless.  AAO x 3, anxious and irritable. No acute distress noted, cooperative, monitoring for safety, Q 15 min checks in effect.

## 2014-11-04 MED ORDER — TRAZODONE HCL 50 MG PO TABS
50.0000 mg | ORAL_TABLET | Freq: Every evening | ORAL | Status: DC | PRN
  Administered 2014-11-04: 50 mg via ORAL
  Filled 2014-11-04: qty 1

## 2014-11-04 NOTE — Consult Note (Signed)
[facility] Consult   Reason for Consult:  Polysubstance abuse Referring Physician:  EDP Patient Identification: Martin Allen MRN:  [id number] Principal Diagnosis: Polysubstance abuse Diagnosis:   Patient Active Problem List   Diagnosis Date Noted  . Suicidal ideation [R45.851] 11/05/2014    Priority: High  . Polysubstance abuse [F19.10] 08/23/2013    Priority: High  . Pneumothorax [J93.9] 08/23/2013  . Hepatitis C [B19.20] 08/23/2013  . Leucocytosis [D72.829] 08/23/2013    Total Time spent with patient: 30 minutes  Subjective:   Martin Allen is a 34 y.o. male patient admitted with polysubstance abuse. Pt seen and chart reviewed with this NP and Dr. Darleene Cleaver. Pt continues to present with severe concerns of substance abuse with impaired ADL's in addition to suicidal ideation.  HPI:  This patient is a 34 year old white male who states that he is homeless. He has a long-standing history of polysubstance abuse dating back at least 15 years. 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 He also uses opiates, marijuana alcohol and Xanax.  The patient stated that he is desperate to get off drugs now. He's been in various treatment centers and claims his most recent treatment was at old [facility]. He states that as soon as he gets done he starts using again and needs "long-term treatment." He states that at this point he is unable to function. He does not brush his teeth are take care of himself and is never any would live. He claims that he will kill himself if he has to go back into the same situation. He denies auditory or visual hallucinations or thoughts of hurting other people. He is extremely anxious and was irritable and angry today.  HPI Elements:   Location:  Global. Quality:  Worsening. Severity:  Severe. Timing:  Past 2 weeks. Duration:  15 years. Context:  Inability to stop using addictive drugs.  Past Medical History:  Past Medical  History  Diagnosis Date  . Hepatitis C virus   . Heroin addiction   . Mental disorder   . Depression   . Bipolar disorder (manic depression)    History reviewed. No pertinent past surgical history. Family History:  Family History  Problem Relation Age of Onset  . Hypertension Other    Social History:  History  Alcohol Use  . 7.2 oz/week  . 12 Cans of beer per week     History  Drug Use  . Yes  . Special: Marijuana, Heroin, Cocaine, Methamphetamines, IV    Comment: heroin    History   Social History  . Marital Status: Single    Spouse Name: N/A  . Number of Children: N/A  . Years of Education: N/A   Social History Main Topics  . Smoking status: Current Every Day Smoker -- 1.00 packs/day    Types: Cigarettes  . Smokeless tobacco: Not on file  . Alcohol Use: 7.2 oz/week    12 Cans of beer per week  . Drug Use: Yes    Special: Marijuana, Heroin, Cocaine, Methamphetamines, IV     Comment: heroin  . Sexual Activity: Not on file   Other Topics Concern  . None   Social History Narrative   Additional Social History:    Pain Medications: abuses oxycodone Prescriptions: see PTA Over the Counter: see PTA History of alcohol / drug use?: Yes Longest period of sobriety (when/how long): days, no hx of seizure reported  Negative Consequences of Use: Personal relationships, Work / [employer]  Withdrawal Symptoms:  (none currently reported) Name of Substance 1: Meth 1 - Age of First Use: 20 tried, but began use about three years ago  1 - Amount (size/oz): 2 grams per day when using 1 - Frequency: reports uses for a week or two then not for a week 1 - Duration: three years 1 - Last Use / Amount: 2 am 1/4 gram 11-01-14 Name of Substance 2: cocaine (powder) and crack 2 - Age of First Use: 16 one time use than began at 78 2 - Amount (size/oz): varies 2 - Frequency: rarely for powder cocaine, varies for crack 2 - Duration: on and off for years 2 - Last Use / Amount: 11-01-14  $100 Name of Substance 3: THC 3 - Age of First Use: 15 3 - Amount (size/oz): pt unsure reports amount has decreased as meth use has increased 3 - Frequency: daily  3 - Duration: years 3 - Last Use / Amount: 11-01-14 Name of Substance 4: MDMA-ectasy 4 - Age of First Use: [age] and then again recently  4 - Amount (size/oz): unknown 4 - Frequency: constantly for the last month  4 - Duration: on and off  4 - Last Use / Amount: uncertain  Name of Substance 5: etoh  5 - Age of First Use: 16 5 - Amount (size/oz): a couple of 46s 5 - Frequency: 1-2 times per week  5 - Duration: years 5 - Last Use / Amount: uncertain  Name of Substance 6: heroin 6 - Age of First Use: 23 6 - Amount (size/oz): varies 6 - Frequency: reports was using more frequently in the past not sure of current use, attempted to get on 11-01-14 6 - Duration: years 6 - Last Use / Amount: "a few months ago" Name of Substance 8: opioid 8 - Age of First Use: unknown 8 - Amount (size/oz): 30 -100 mg 8 - Frequency: almost daily  8 - Duration: increase in use recently  8 - Last Use / Amount: unknown        Allergies:   Allergies  Allergen Reactions  . Ceclor [Cefaclor] Itching and Rash  . Penicillins Itching and Rash    Labs:  Results for orders placed or performed during the hospital encounter of 11/02/14 (from the past 48 hour(s))  CBC with Differential     Status: Abnormal   Collection Time: 11/03/14 12:59 AM  Result Value Ref Range   WBC 6.8 4.0 - 10.5 K/uL   RBC 4.57 4.22 - 5.81 MIL/uL   Hemoglobin 14.4 13.0 - 17.0 g/dL   HCT 42.0 39.0 - 52.0 %   MCV 91.9 78.0 - 100.0 fL   MCH 31.5 26.0 - 34.0 pg   MCHC 34.3 30.0 - 36.0 g/dL   RDW 12.9 11.5 - 15.5 %   Platelets 197 150 - 400 K/uL   Neutrophils Relative % 43 43 - 77 %   Neutro Abs 2.9 1.7 - 7.7 K/uL   Lymphocytes Relative 39 12 - 46 %   Lymphs Abs 2.6 0.7 - 4.0 K/uL   Monocytes Relative 13 (H) 3 - 12 %   Monocytes Absolute 0.9 0.1 - 1.0 K/uL    Eosinophils Relative 5 0 - 5 %   Eosinophils Absolute 0.3 0.0 - 0.7 K/uL   Basophils Relative 0 0 - 1 %   Basophils Absolute 0.0 0.0 - 0.1 K/uL  Comprehensive metabolic panel     Status: Abnormal   Collection Time: 11/03/14 12:59 AM  Result Value  Ref Range   Sodium 138 135 - 145 mmol/L   Potassium 3.9 3.5 - 5.1 mmol/L   Chloride 105 101 - 111 mmol/L   CO2 25 22 - 32 mmol/L   Glucose, Bld 98 65 - 99 mg/dL   BUN 15 6 - 20 mg/dL   Creatinine, Ser 0.92 0.61 - 1.24 mg/dL   Calcium 8.6 (L) 8.9 - 10.3 mg/dL   Total Protein 6.8 6.5 - 8.1 g/dL   Albumin 3.9 3.5 - 5.0 g/dL   AST 102 (H) 15 - 41 U/L   ALT 172 (H) 17 - 63 U/L   Alkaline Phosphatase 106 38 - 126 U/L   Total Bilirubin 0.8 0.3 - 1.2 mg/dL   GFR calc non Af Amer >60 >60 mL/min   GFR calc Af Amer >60 >60 mL/min    Comment: (NOTE) The eGFR has been calculated using the CKD EPI equation. This calculation has not been validated in all clinical situations. eGFR's persistently <60 mL/min signify possible Chronic Kidney Disease.    Anion gap 8 5 - 15  Ethanol     Status: None   Collection Time: 11/03/14 12:59 AM  Result Value Ref Range   Alcohol, Ethyl (B) <5 <5 mg/dL    Comment:        LOWEST DETECTABLE LIMIT FOR SERUM ALCOHOL IS 5 mg/dL FOR MEDICAL PURPOSES ONLY   Urine rapid drug screen (hosp performed)     Status: Abnormal   Collection Time: 11/03/14  1:39 AM  Result Value Ref Range   Opiates NONE DETECTED NONE DETECTED   Cocaine POSITIVE (A) NONE DETECTED   Benzodiazepines NONE DETECTED NONE DETECTED   Amphetamines POSITIVE (A) NONE DETECTED   Tetrahydrocannabinol POSITIVE (A) NONE DETECTED   Barbiturates NONE DETECTED NONE DETECTED    Comment:        DRUG SCREEN FOR MEDICAL PURPOSES ONLY.  IF CONFIRMATION IS NEEDED FOR ANY PURPOSE, NOTIFY LAB WITHIN 5 DAYS.        LOWEST DETECTABLE LIMITS FOR URINE DRUG SCREEN Drug Class       Cutoff (ng/mL) Amphetamine      1000 Barbiturate      200 Benzodiazepine    983 Tricyclics       382 Opiates          300 Cocaine          300 THC              50     Vitals: Blood pressure 129/66, pulse 78, temperature 98.4 F (36.9 C), temperature source Oral, resp. rate 16, height $RemoveBe'6\' 1"'eWQEAVxYl$  (1.854 m), weight 79.379 kg (175 lb), SpO2 99 %.  Risk to Self: Suicidal Ideation: Yes-Currently Present Suicidal Intent: Yes-Currently Present Is patient at risk for suicide?: Yes Suicidal Plan?: Yes-Currently Present Specify Current Suicidal Plan: pt plans to overdose on heroin  Access to Means: Yes Specify Access to Suicidal Means: drugs What has been your use of drugs/alcohol within the last 12 months?: Pt has been abusing drugs since age 43 and currently abuses multiple subances, see narrative  How many times?: 0 Other Self Harm Risks: manufactures meth  Triggers for Past Attempts: None known Intentional Self Injurious Behavior: None Risk to Others: Homicidal Ideation: No Thoughts of Harm to Others: No Current Homicidal Intent: No Current Homicidal Plan: No Access to Homicidal Means: No Identified Victim: none History of harm to others?: No Assessment of Violence: None Noted Violent Behavior Description: none Does patient have access to weapons?:  No Criminal Charges Pending?: No Does patient have a court date: No Prior Inpatient Therapy: Prior Inpatient Therapy: Yes Prior Therapy Dates: most recent two weeks ago, multiple Prior Therapy Facilty/Provider(s): [facility], [facility], [facility], [facility] Reason for Treatment: SA Prior Outpatient Therapy: Prior Outpatient Therapy: No Prior Therapy Dates: NA Prior Therapy Facilty/Provider(s): NA Reason for Treatment: NA Does patient have an ACCT team?: No Does patient have Intensive In-House Services?  : No Does patient have [facility] services? : No Does patient have P4CC services?: No  Current Facility-Administered Medications  Medication Dose Route Frequency Provider Last Rate Last Dose  . acetaminophen (TYLENOL) tablet 650 mg  650  mg Oral Q4H PRN Antonietta Breach, PA-C      . ibuprofen (ADVIL,MOTRIN) tablet 600 mg  600 mg Oral Q8H PRN Antonietta Breach, PA-C      . nicotine (NICODERM CQ - dosed in mg/24 hours) patch 21 mg  21 mg Transdermal Daily Antonietta Breach, PA-C   21 mg at 11/03/14 1155  . ondansetron (ZOFRAN) tablet 4 mg  4 mg Oral Q8H PRN Antonietta Breach, PA-C       Current Outpatient Prescriptions  Medication Sig Dispense Refill  . ibuprofen (ADVIL,MOTRIN) 200 MG tablet Take 200 mg by mouth every 6 (six) hours as needed.    . naproxen sodium (ANAPROX) 220 MG tablet Take 440 mg by mouth 2 (two) times daily as needed (pain).      Musculoskeletal: Strength & Muscle Tone: within normal limits Gait & Station: normal Patient leans: N/A  Psychiatric Specialty Exam: Physical Exam  Review of Systems  Psychiatric/Behavioral: Positive for depression, suicidal ideas and substance abuse. The patient is nervous/anxious and has insomnia.   All other systems reviewed and are negative.   Blood pressure 129/66, pulse 78, temperature 98.4 F (36.9 C), temperature source Oral, resp. rate 16, height $RemoveBe'6\' 1"'bNmQMGALC$  (1.854 m), weight 79.379 kg (175 lb), SpO2 99 %.Body mass index is 23.09 kg/(m^2).  General Appearance: Disheveled  Eye Contact::  Poor  Speech:  Pressured  Volume:  Normal  Mood:  Angry, Anxious and Depressed  Affect:  Depressed  Thought Process:  Coherent  Orientation:  Full (Time, Place, and Person)  Thought Content:  Rumination  Suicidal Thoughts:  Yes.  with intent/plan  Homicidal Thoughts:  No  Memory:  Immediate;   Fair Recent;   Fair Remote;   Fair  Judgement:  Poor  Insight:  Lacking  Psychomotor Activity:  Restlessness  Concentration:  Poor  Recall:  Poor  Fund of Knowledge:Fair  Language: Good  Akathisia:  No  Handed:  Right  AIMS (if indicated):     Assets:  Communication Skills Desire for Improvement Resilience  ADL's:  Impaired  Cognition: WNL  Sleep:      Medical Decision Making: Review of Psycho-Social  Stressors (1), Review or order clinical lab tests (1), Review and summation of old records (2), Established Problem, Worsening (2), Review of Medication Regimen & Side Effects (2) and Review of New Medication or Change in Dosage (2)  Treatment Plan Summary: Polysubstance abuse, unstable, warrants inpatient admission; suicidal ideation persistent  Daily contact with patient to assess and evaluate symptoms and progress in treatment and Medication management  Plan:  Recommend psychiatric Inpatient admission when medically cleared.  Disposition: Inpatient treatment for substance abuse and depression; continues to warrant emergent inpatient psychiatric admission for safety and stabilization  Benjamine Mola, FNP-BC 11/04/2014 4:27 PM Patient seen face-to-face for psychiatric evaluation, chart reviewed and case discussed with the physician extender  and developed treatment plan. Reviewed the information documented and agree with the treatment plan. Corena Pilgrim, MD

## 2014-11-04 NOTE — ED Notes (Signed)
Patient appears flat. Calm, cooperative. Minimal interaction with peers and staff. Requests medication for sleep.   Encouragement offered.  Q 15 safety checks continue.

## 2014-11-04 NOTE — BH Assessment (Signed)
[facility] Assessment Progress Note  The following facilities have been contacted to seek placement for this pt, with results as noted:  Beds available, information sent, decision pending:  [facility]  At capacity:  [facility] [place] Triage Specialist [phone]

## 2014-11-04 NOTE — Progress Notes (Signed)
CM spoke with pt who confirms self pay [facility] resident with no pcp.  CM discussed and provided written information for self pay pcps, discussed the importance of pcp vs EDP services for f/u care, www.needymeds.org, www.goodrx.com, discounted pharmacies and other [employer] such as [employer] [facility] , [employer], affordable care act,  [place] med assist, financial assistance, self pay dental services, [place] med assist, DSS and  health department  Reviewed resources for [employer] self pay pcps like [facility], family medicine at [facility] [employer] [facility], [facility], [facility], local urgent care centers, [facility], [facility] family practice, general medical clinics, family services of the [place], [facility] urgent care plus others, medication resources, [facility] out patient pharmacies and housing Pt voiced understanding and appreciation of resources provided   Provided [facility] contact information Pt did not agreed to a referral

## 2014-11-05 DIAGNOSIS — R45851 Suicidal ideations: Secondary | ICD-10-CM

## 2014-11-05 NOTE — BH Assessment (Signed)
[facility] Assessment Progress Note  Per Thedore Mins, MD, this pt does not require psychiatric hospitalization.  He asks me to speak to the pt to ascertain what kind of treatment he is requesting at this time.  Pt reports that he is interested in a long term residential substance abuse treatment program.  I mentioned [facility] as an option, but pt reports that he can no longer go there.  Pt then asked to be discharged from the ED as soon as possible, declining any other referral information.  Pt's nurse, Kendal Hymen, has been notified.  Doylene Canning, MA Triage Specialist [phone]

## 2014-11-05 NOTE — [facility] Suicide Risk Assessment (Cosign Needed)
Suicide Risk Assessment  Discharge Assessment   [facility] Discharge Suicide Risk Assessment   Demographic Factors:  Male, Adolescent or young adult, Low socioeconomic status, Living alone and Unemployed  Total Time spent with patient: 20 minutes  Musculoskeletal: Strength & Muscle Tone: within normal limits Gait & Station: normal Patient leans: N/A  Psychiatric Specialty Exam:     Blood pressure 109/68, pulse 71, temperature 98.1 F (36.7 C), temperature source Oral, resp. rate 17, height 6\' 1"  (1.854 m), weight 79.379 kg (175 lb), SpO2 98 %.Body mass index is 23.09 kg/(m^2).  General Appearance: Casual  Eye Contact::  Good  Speech:  Clear and Coherent and Normal Rate409  Volume:  Normal  Mood:  Anxious  Affect:  Congruent  Thought Process:  Coherent, Goal Directed and Intact  Orientation:  Full (Time, Place, and Person)  Thought Content:  WDL  Suicidal Thoughts:  No  Homicidal Thoughts:  No  Memory:  Immediate;   Good Recent;   Good Remote;   Good  Judgement:  Good  Insight:  Good  Psychomotor Activity:  Normal  Concentration:  Good  Recall:  NA  Fund of Knowledge:Good  Language: Good  Akathisia:  NA  Handed:  Right  AIMS (if indicated):     Assets:  Desire for Improvement  Sleep:     Cognition: WNL  ADL's:  Intact      Has this patient used any form of tobacco in the last 30 days? (Cigarettes, Smokeless Tobacco, Cigars, and/or Pipes) Yes, A prescription for an FDA-approved tobacco cessation medication was offered at discharge and the patient refused  Mental Status Per Nursing Assessment::   On Admission:     Current Mental Status by Physician: NA  Loss Factors: NA  Historical Factors: NA  Risk Reduction Factors:   Living with another person, especially a relative  Continued Clinical Symptoms:  Alcohol/Substance Abuse/Dependencies  Cognitive Features That Contribute To Risk:  Closed-mindedness and Polarized thinking    Suicide Risk:  Minimal: No  identifiable suicidal ideation.  Patients presenting with no risk factors but with morbid ruminations; may be classified as minimal risk based on the severity of the depressive symptoms  Principal Problem: Polysubstance abuse Discharge Diagnoses:  Patient Active Problem List   Diagnosis Date Noted  . Suicidal ideation [R45.851] 11/05/2014  . Pneumothorax [J93.9] 08/23/2013  . Polysubstance abuse [F19.10] 08/23/2013  . Hepatitis C [B19.20] 08/23/2013  . Leucocytosis [D72.829] 08/23/2013      Plan Of Care/Follow-up recommendations:  Activity:  as tolerated Diet:  regular  Is patient on multiple antipsychotic therapies at discharge:  No   Has Patient had three or more failed trials of antipsychotic monotherapy by history:  No  Recommended Plan for Multiple Antipsychotic Therapies: NA    Verdelle Valtierra C    PMHNP-BC 11/05/2014, 9:57 AM

## 2014-11-05 NOTE — Consult Note (Signed)
[facility] Face-to-Face Psychiatry Consult   Reason for Consult:  Polysubstance abuse Referring Physician:  EDP Patient Identification: Martin Allen MRN:  [id number] Principal Diagnosis: Polysubstance abuse Diagnosis:   Patient Active Problem List   Diagnosis Date Noted  . Suicidal ideation [R45.851] 11/05/2014  . Pneumothorax [J93.9] 08/23/2013  . Polysubstance abuse [F19.10] 08/23/2013  . Hepatitis C [B19.20] 08/23/2013  . Leucocytosis [D72.829] 08/23/2013    Total Time spent with patient: 30 minutes  Subjective:   ABAD EDGE is a 34 y.o. male patient admitted with polysubstance abuse. Pt seen and chart reviewed with this NP and Dr. Jannifer Franklin. Pt continues to present with severe concerns of substance abuse with impaired ADL's in addition to suicidal ideation.  HPI:  This patient is a 34 year old white male who states that he is homeless. He has a long-standing history of polysubstance abuse dating back at least 15 years. 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 He also uses opiates, marijuana alcohol and Xanax.  The patient stated that he is desperate to get off drugs now. He's been in various treatment centers and claims his most recent treatment was at old [facility]. He states that as soon as he gets done he starts using again and needs "long-term treatment." He states that at this point he is unable to function. He does not brush his teeth are take care of himself and is never any would live. He claims that he will kill himself if he has to go back into the same situation. He denies auditory or visual hallucinations or thoughts of hurting other people. He is extremely anxious and was irritable and angry today.  Seen today and denied SI/HI/AVH however patient want to go to a long term rehabilitation  facility from here.  Patient was given information for outpatient rehabilitation facilities.  Patient was discharged home.  HPI Elements:   Location:   [facility]. Quality:  Worsening. Severity:  Severe. Timing:  Past 2 weeks. Duration:  15 years. Context:  Inability to stop using addictive drugs.  Past Medical History:  Past Medical History  Diagnosis Date  . Hepatitis C virus   . Heroin addiction   . Mental disorder   . Depression   . Bipolar disorder (manic depression)    History reviewed. No pertinent past surgical history. Family History:  Family History  Problem Relation Age of Onset  . Hypertension Other    Social History:  History  Alcohol Use  . 7.2 oz/week  . 12 Cans of beer per week     History  Drug Use  . Yes  . Special: Marijuana, Heroin, Cocaine, Methamphetamines, IV    Comment: heroin    History   Social History  . Marital Status: Single    Spouse Name: N/A  . Number of Children: N/A  . Years of Education: N/A   Social History Main Topics  . Smoking status: Current Every Day Smoker -- 1.00 packs/day    Types: Cigarettes  . Smokeless tobacco: Not on file  . Alcohol Use: 7.2 oz/week    12 Cans of beer per week  . Drug Use: Yes    Special: Marijuana, Heroin, Cocaine, Methamphetamines, IV     Comment: heroin  . Sexual Activity: Not on file   Other Topics Concern  . None   Social History Narrative   Additional Social History:    Pain Medications: abuses oxycodone Prescriptions: see PTA Over the Counter: see PTA History of alcohol / drug  use?: Yes Longest period of sobriety (when/how long): days, no hx of seizure reported  Negative Consequences of Use: Personal relationships, Work / Programmer, multimedia Withdrawal Symptoms:  (none currently reported) Name of Substance 1: Meth 1 - Age of First Use: 20 tried, but began use about three years ago  1 - Amount (size/oz): 2 grams per day when using 1 - Frequency: reports uses for a week or two then not for a week 1 - Duration: three years 1 - Last Use / Amount: 2 am 1/4 gram 11-01-14 Name of Substance 2: cocaine (powder) and crack 2 - Age of First Use: 16 one  time use than began at 19 2 - Amount (size/oz): varies 2 - Frequency: rarely for powder cocaine, varies for crack 2 - Duration: on and off for years 2 - Last Use / Amount: 11-01-14 $100 Name of Substance 3: THC 3 - Age of First Use: 15 3 - Amount (size/oz): pt unsure reports amount has decreased as meth use has increased 3 - Frequency: daily  3 - Duration: years 3 - Last Use / Amount: 11-01-14 Name of Substance 4: MDMA-ectasy 4 - Age of First Use: [age] and then again recently  4 - Amount (size/oz): unknown 4 - Frequency: constantly for the last month  4 - Duration: on and off  4 - Last Use / Amount: uncertain  Name of Substance 5: etoh  5 - Age of First Use: 16 5 - Amount (size/oz): a couple of 40s 5 - Frequency: 1-2 times per week  5 - Duration: years 5 - Last Use / Amount: uncertain  Name of Substance 6: heroin 6 - Age of First Use: 23 6 - Amount (size/oz): varies 6 - Frequency: reports was using more frequently in the past not sure of current use, attempted to get on 11-01-14 6 - Duration: years 6 - Last Use / Amount: "a few months ago" Name of Substance 8: opioid 8 - Age of First Use: unknown 8 - Amount (size/oz): 30 -100 mg 8 - Frequency: almost daily  8 - Duration: increase in use recently  8 - Last Use / Amount: unknown        Allergies:   Allergies  Allergen Reactions  . Ceclor [Cefaclor] Itching and Rash  . Penicillins Itching and Rash    Labs:  No results found for this or any previous visit (from the past 48 hour(s)).  Vitals: Blood pressure 109/68, pulse 71, temperature 98.1 F (36.7 C), temperature source Oral, resp. rate 17, height  (1.854 m), weight 79.379 kg (175 lb), SpO2 98 %.  Risk to Self: Suicidal Ideation: Yes-Currently Present Suicidal Intent: Yes-Currently Present Is patient at risk for suicide?: Yes Suicidal Plan?: Yes-Currently Present Specify Current Suicidal Plan: pt plans to overdose on heroin  Access to Means: Yes Specify  Access to Suicidal Means: drugs What has been your use of drugs/alcohol within the last 12 months?: Pt has been abusing drugs since age 50 and currently abuses multiple subances, see narrative  How many times?: 0 Other Self Harm Risks: manufactures meth  Triggers for Past Attempts: None known Intentional Self Injurious Behavior: None Risk to Others: Homicidal Ideation: No Thoughts of Harm to Others: No Current Homicidal Intent: No Current Homicidal Plan: No Access to Homicidal Means: No Identified Victim: none History of harm to others?: No Assessment of Violence: None Noted Violent Behavior Description: none Does patient have access to weapons?: No Criminal Charges Pending?: No Does patient have a court date:  No Prior Inpatient Therapy: Prior Inpatient Therapy: Yes Prior Therapy Dates: most recent two weeks ago, multiple Prior Therapy Facilty/Provider(s): [facility], [facility], [facility], [facility] Reason for Treatment: SA Prior Outpatient Therapy: Prior Outpatient Therapy: No Prior Therapy Dates: NA Prior Therapy Facilty/Provider(s): NA Reason for Treatment: NA Does patient have an ACCT team?: No Does patient have Intensive In-House Services?  : No Does patient have [facility] services? : No Does patient have P4[facility] services?: No  Current Facility-Administered Medications  Medication Dose Route Frequency Provider Last Rate Last Dose  . acetaminophen (TYLENOL) tablet 650 mg  650 mg Oral Q4H PRN Antony Madura, PA-C      . ibuprofen (ADVIL,MOTRIN) tablet 600 mg  600 mg Oral Q8H PRN Antony Madura, PA-C      . nicotine (NICODERM CQ - dosed in mg/24 hours) patch 21 mg  21 mg Transdermal Daily Antony Madura, PA-C   21 mg at 11/03/14 1155  . ondansetron (ZOFRAN) tablet 4 mg  4 mg Oral Q8H PRN Antony Madura, PA-C      . traZODone (DESYREL) tablet 50 mg  50 mg Oral QHS PRN,MR X 1 Kerry Hough, PA-C   50 mg at 11/04/14 2104   Current Outpatient Prescriptions  Medication Sig Dispense Refill  . ibuprofen (ADVIL,MOTRIN)  200 MG tablet Take 200 mg by mouth every 6 (six) hours as needed.    . naproxen sodium (ANAPROX) 220 MG tablet Take 440 mg by mouth 2 (two) times daily as needed (pain).      Musculoskeletal: Strength & Muscle Tone: within normal limits Gait & Station: normal Patient leans: N/A  Psychiatric Specialty Exam: Physical Exam  Review of Systems  Psychiatric/Behavioral: Positive for depression, suicidal ideas and substance abuse. The patient is nervous/anxious and has insomnia.   All other systems reviewed and are negative.   Blood pressure 109/68, pulse 71, temperature 98.1 F (36.7 C), temperature source Oral, resp. rate 17, height  (1.854 m), weight 79.379 kg (175 lb), SpO2 98 %.Body mass index is 23.09 kg/(m^2).  General Appearance: Casual  Eye Contact::  Good  Speech:  Clear and Coherent and Normal Rate409  Volume:  Normal  Mood:  Anxious  Affect:  Congruent  Thought Process:  Coherent, Goal Directed and Intact  Orientation:  Full (Time, Place, and Person)  Thought Content:  WDL  Suicidal Thoughts:  No  Homicidal Thoughts:  No  Memory:  Immediate;   Good Recent;   Good Remote;   Good  Judgement:  Good  Insight:  Good  Psychomotor Activity:  Normal  Concentration:  Good  Recall:  NA  Fund of Knowledge:Good  Language: Good  Akathisia:  NA  Handed:  Right  AIMS (if indicated):     Assets:  Desire for Improvement  Sleep:     Cognition: WNL  ADL's:  Intact      Sleep:      Medical Decision Making: Established Problem, Stable/Improving (1)   Disposition: DISCHARGE HOME. Earney Navy, PMHNP--BC 11/05/2014 10:01 AM Patient seen face-to-face for psychiatric evaluation, chart reviewed and case discussed with the physician extender and developed treatment plan. Reviewed the information documented and agree with the treatment plan. Thedore Mins, MD

## 2015-01-23 DEATH — deceased

## 2016-11-02 IMAGING — CR DG CHEST 2V
2 series · 2 of 2 positions shown · non-contrast
Comparison: Prior radiograph from 11/25/2013

CLINICAL DATA: Initial evaluation for acute central chest pain for
1 day. History of prior pneumothorax.

EXAM:
CHEST  2 VIEW

[w chest pa]
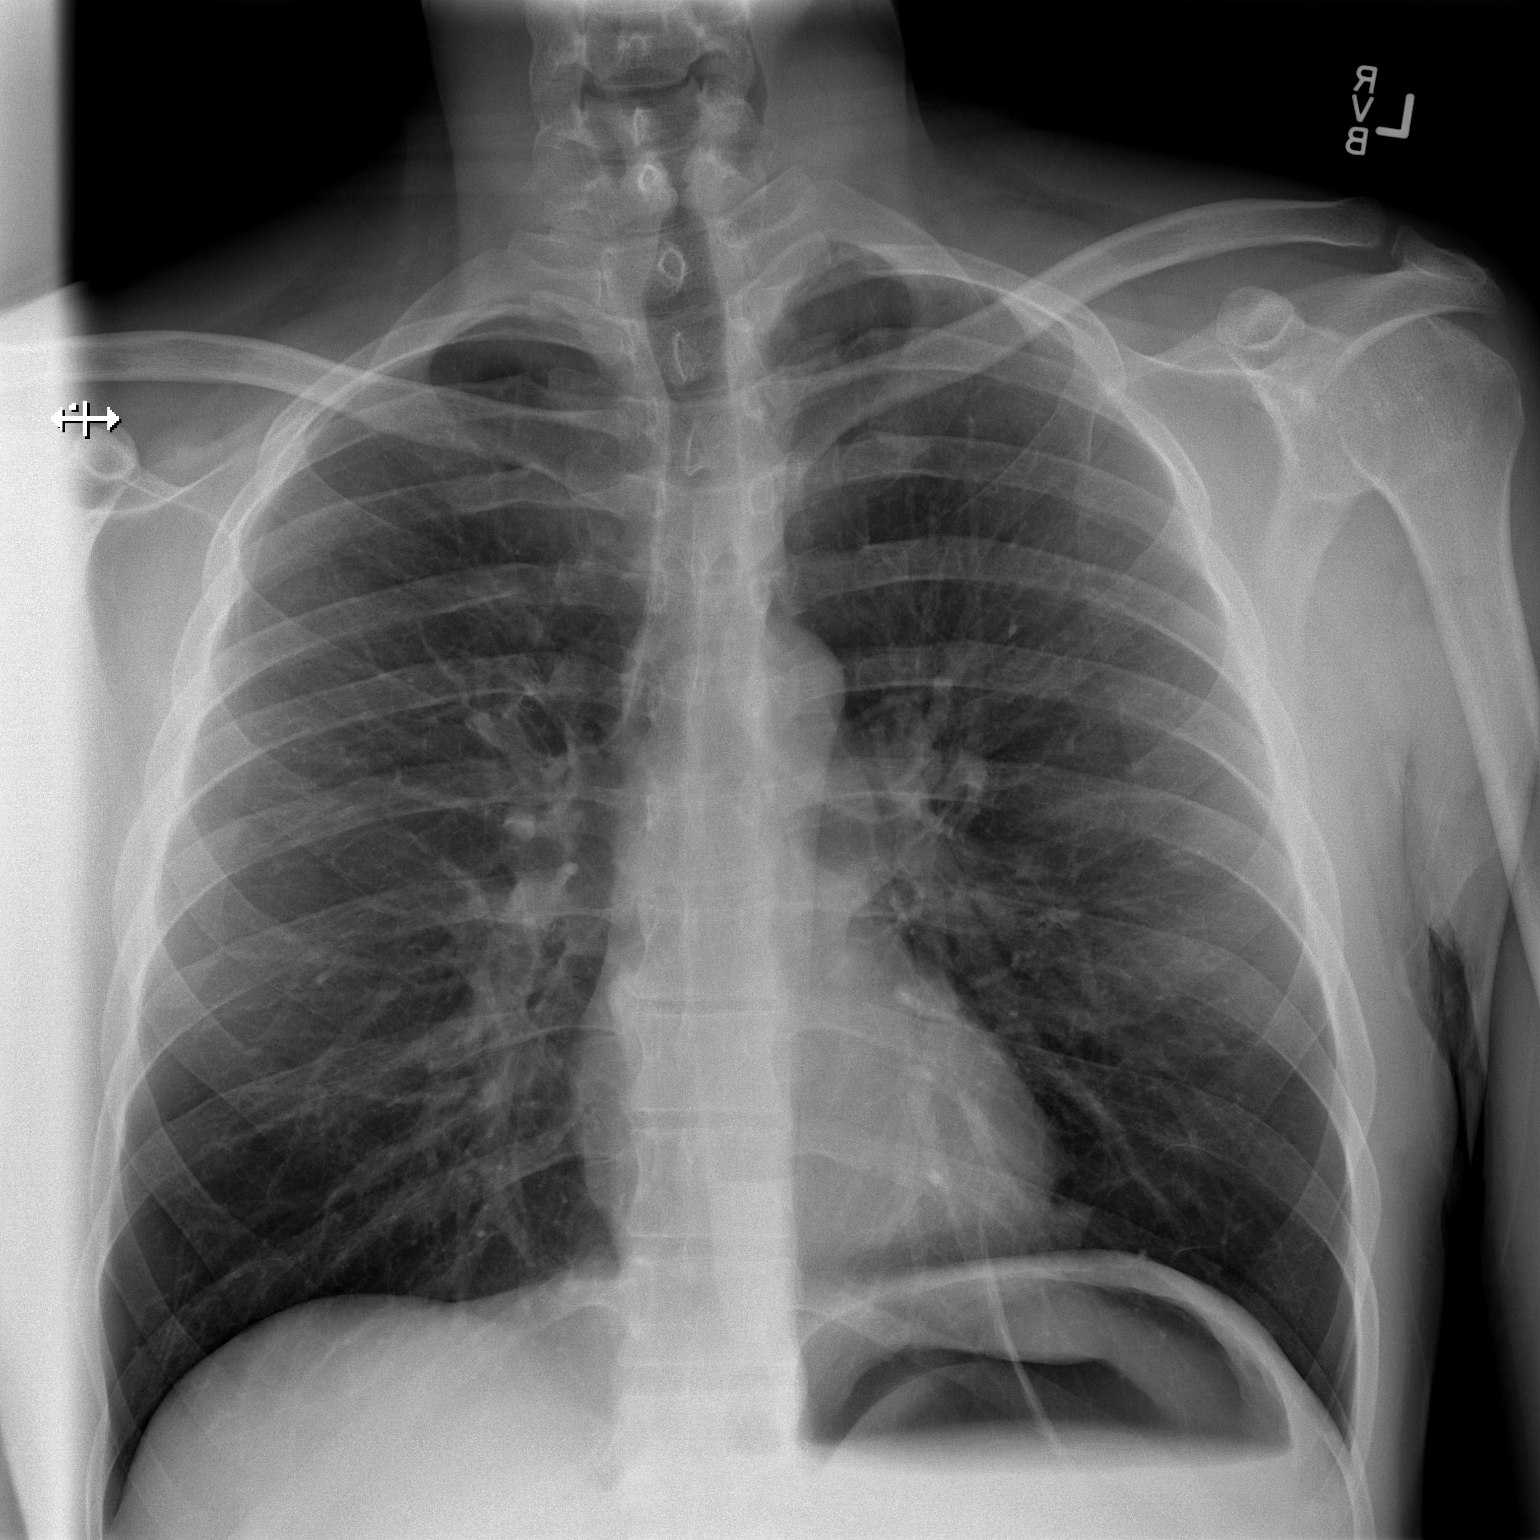

[w chest lat]
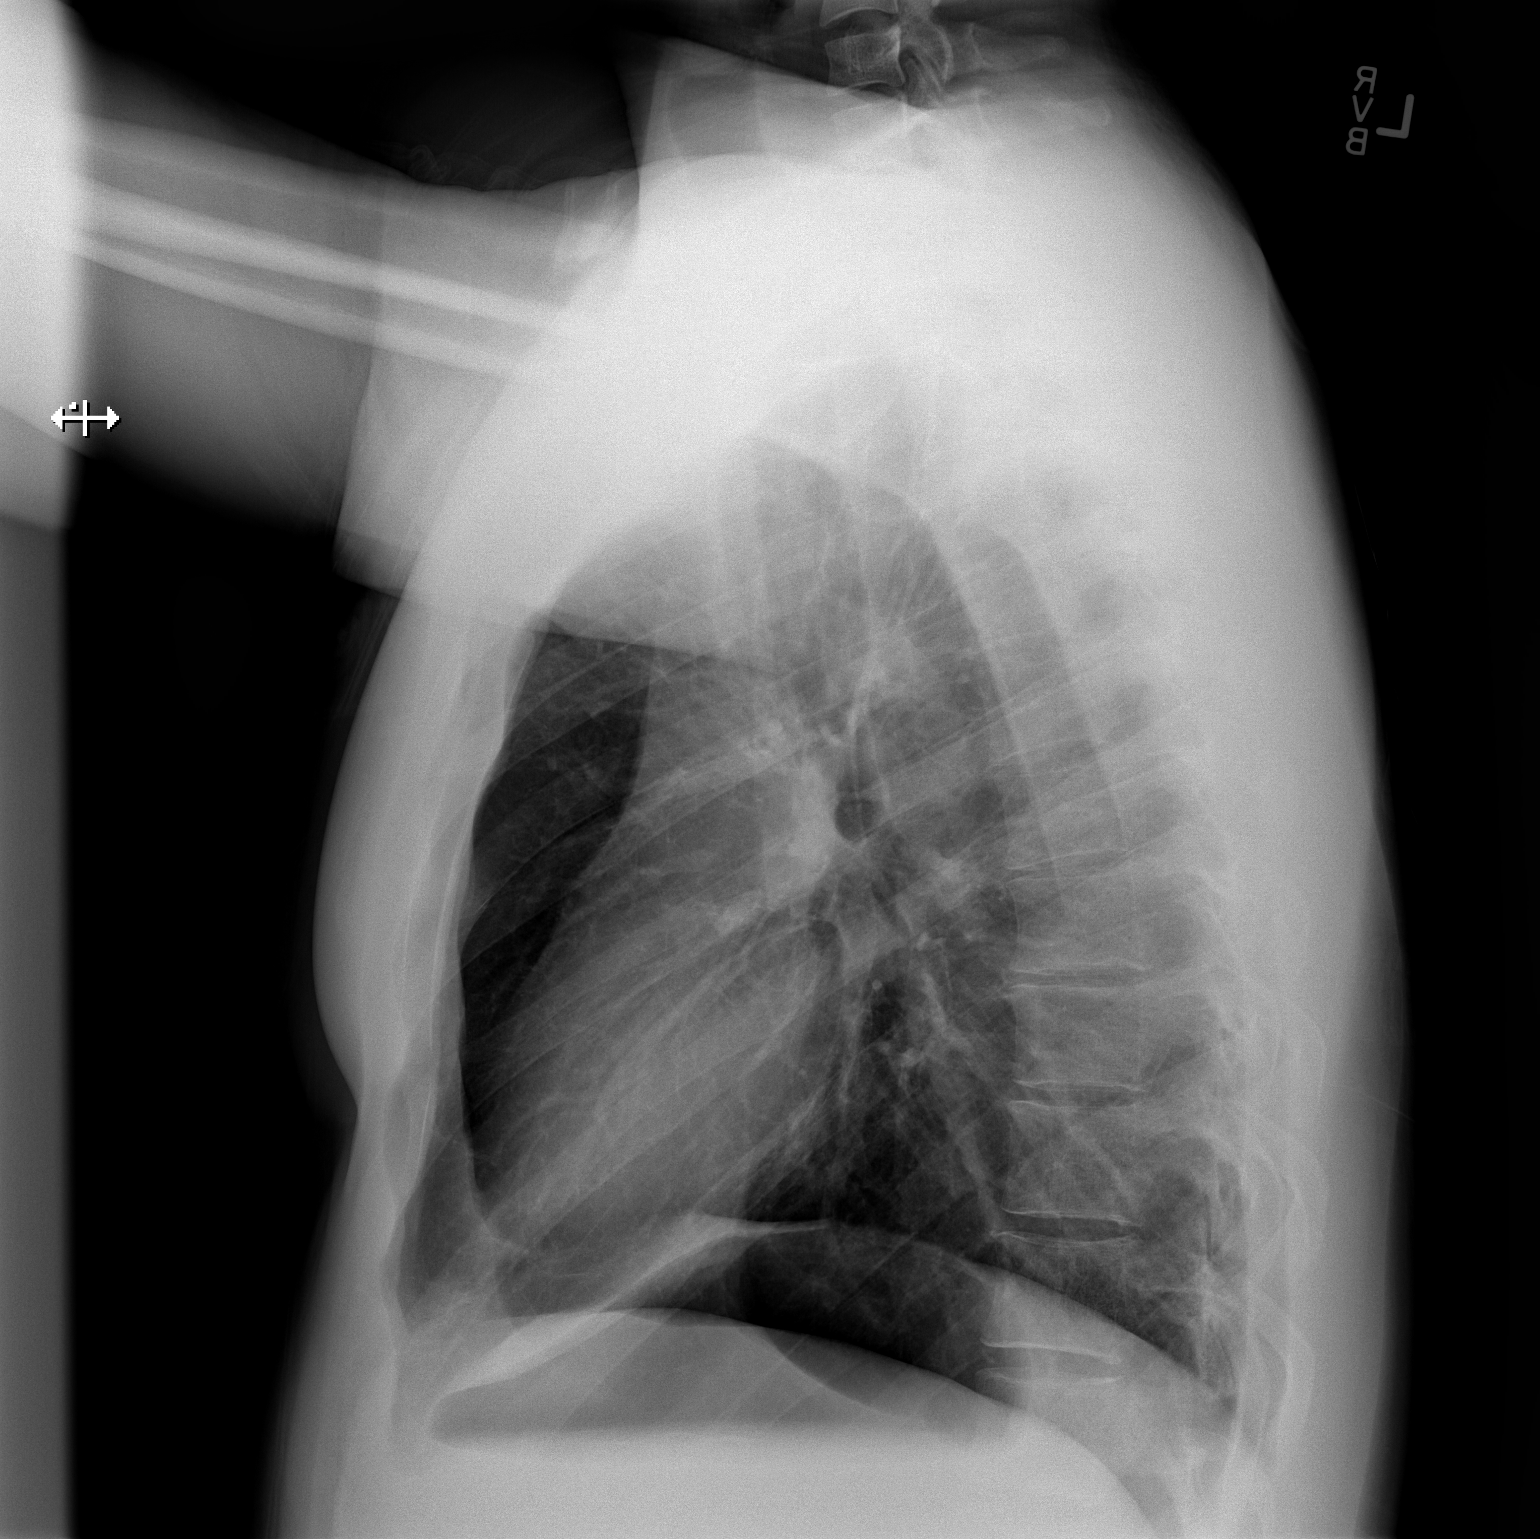

[2 of 2 positions shown; findings below may reference images not displayed]

FINDINGS: The cardiac and mediastinal silhouettes are stable in size and
contour, and remain within normal limits.

The lungs are normally inflated. No airspace consolidation, pleural
effusion, or pulmonary edema is identified. There is no
pneumothorax.

No acute osseous abnormality identified.
IMPRESSION: No active cardiopulmonary disease.
